# Patient Record
Sex: Female | Born: 1965 | Race: Black or African American | Hispanic: No | Marital: Single | Smoking: Never smoker
Health system: Southern US, Community
[De-identification: ages and names within clinical notes are randomized; demographics above are authoritative.]

---

## 2008-08-14 ENCOUNTER — Emergency Department (HOSPITAL_COMMUNITY): Admission: EM | Admit: 2008-08-14 | Discharge: 2008-08-14 | Payer: Self-pay | Admitting: Family Medicine

## 2009-06-19 ENCOUNTER — Emergency Department (HOSPITAL_COMMUNITY): Admission: EM | Admit: 2009-06-19 | Discharge: 2009-06-19 | Payer: Self-pay | Admitting: Emergency Medicine

## 2010-09-05 ENCOUNTER — Emergency Department (HOSPITAL_BASED_OUTPATIENT_CLINIC_OR_DEPARTMENT_OTHER): Admission: EM | Admit: 2010-09-05 | Discharge: 2010-09-05 | Payer: Self-pay | Admitting: Emergency Medicine

## 2011-01-19 LAB — URINE MICROSCOPIC-ADD ON

## 2011-01-19 LAB — URINALYSIS, ROUTINE W REFLEX MICROSCOPIC
Leukocytes, UA: NEGATIVE
Nitrite: NEGATIVE

## 2011-01-19 LAB — URINE CULTURE: Culture: NO GROWTH

## 2011-02-12 LAB — POCT URINALYSIS DIP (DEVICE)
Bilirubin Urine: NEGATIVE
Glucose, UA: NEGATIVE mg/dL
Ketones, ur: NEGATIVE mg/dL
Protein, ur: NEGATIVE mg/dL

## 2011-08-09 LAB — POCT URINALYSIS DIP (DEVICE)
Bilirubin Urine: NEGATIVE
Nitrite: NEGATIVE
Operator id: 239701
pH: 6.5

## 2011-08-09 LAB — POCT PREGNANCY, URINE: Preg Test, Ur: NEGATIVE

## 2012-01-25 ENCOUNTER — Ambulatory Visit: Payer: BC Managed Care – PPO

## 2012-07-17 ENCOUNTER — Ambulatory Visit (INDEPENDENT_AMBULATORY_CARE_PROVIDER_SITE_OTHER): Payer: BC Managed Care – PPO | Admitting: Family Medicine

## 2012-07-17 VITALS — BP 101/70 | HR 82 | Temp 98.4°F | Resp 17 | Ht 63.5 in | Wt 116.0 lb

## 2012-07-17 DIAGNOSIS — K047 Periapical abscess without sinus: Secondary | ICD-10-CM

## 2012-07-17 MED ORDER — PENICILLIN V POTASSIUM 500 MG PO TABS: 500.0000 mg | ORAL_TABLET | Freq: Four times a day (QID) | ORAL | Status: AC

## 2012-07-17 MED ORDER — IBUPROFEN 800 MG PO TABS: 800.0000 mg | ORAL_TABLET | Freq: Three times a day (TID) | ORAL | Status: AC | PRN

## 2012-07-17 NOTE — Addendum Note (Signed)
Addended by: Norberto Sorenson on: 07/17/2012 02:58 PM   Modules accepted: Level of Service

## 2012-07-17 NOTE — Progress Notes (Signed)
  Subjective:    Patient ID: Mariah Frost, female    DOB: 11-12-1965, 46 y.o.   MRN: [id number]  Abscess This is a new problem. The current episode started today. The problem occurs constantly. The problem has been rapidly worsening. Associated symptoms include chills. Pertinent negatives include no fever, headaches or neck pain. The symptoms are aggravated by eating and swallowing. She has tried nothing for the symptoms.      Review of Systems  Constitutional: Positive for chills. Negative for fever and appetite change.  HENT: Positive for facial swelling. Negative for ear pain, neck pain and neck stiffness.   Neurological: Negative for dizziness, speech difficulty and headaches.  Hematological: Negative for adenopathy.  Psychiatric/Behavioral: Negative for agitation.       Objective:   Physical Exam  Constitutional: She is oriented to person, place, and time. She appears well-developed and well-nourished. No distress.  HENT:  Head: Normocephalic and atraumatic. No trismus in the jaw.  Mouth/Throat: Oropharynx is clear and moist and mucous membranes are normal. Abnormal dentition. Dental abscesses and dental caries present. No lacerations. No oropharyngeal exudate.       Visible edema over left posterior mandibular angle.  Left posterior molar broken w/ gray root and surround gum pale and swollen  Eyes: Conjunctivae are normal. Pupils are equal, round, and reactive to light. No scleral icterus.  Neck: Normal range of motion. Neck supple.  Neurological: She is alert and oriented to person, place, and time.  Skin: Skin is warm and dry. She is not diaphoretic. No erythema.  Psychiatric: She has a normal mood and affect.          Assessment & Plan:  1. Dental abscess - Cover w/ pcn until dental appointment on 9/27 - do not stop abx prior. Prn ibuprofen for pain. vicodin offered but declined.

## 2012-07-18 NOTE — Progress Notes (Signed)
Reviewed and agree.

## 2014-07-14 ENCOUNTER — Emergency Department (HOSPITAL_COMMUNITY)
Admission: EM | Admit: 2014-07-14 | Discharge: 2014-07-14 | Disposition: A | Discharge status: Home / Self Care | Payer: BC Managed Care – PPO | Attending: Emergency Medicine | Admitting: Emergency Medicine

## 2014-07-14 ENCOUNTER — Encounter (HOSPITAL_COMMUNITY): Payer: Self-pay | Admitting: Emergency Medicine

## 2014-07-14 DIAGNOSIS — I891 Lymphangitis: Secondary | ICD-10-CM | POA: Insufficient documentation

## 2014-07-14 DIAGNOSIS — M7989 Other specified soft tissue disorders: Secondary | ICD-10-CM | POA: Diagnosis present

## 2014-07-14 DIAGNOSIS — L02619 Cutaneous abscess of unspecified foot: Secondary | ICD-10-CM | POA: Insufficient documentation

## 2014-07-14 DIAGNOSIS — L03119 Cellulitis of unspecified part of limb: Secondary | ICD-10-CM

## 2014-07-14 MED ORDER — AMOXICILLIN-POT CLAVULANATE 875-125 MG PO TABS: 1.0000 | ORAL_TABLET | Freq: Two times a day (BID) | ORAL | Status: DC

## 2014-07-14 MED ORDER — LIDOCAINE HCL 1 % IJ SOLN
INTRAMUSCULAR | Status: AC
  Administered 2014-07-14: 20 mL
  Filled 2014-07-14: qty 20

## 2014-07-14 MED ORDER — CEFTRIAXONE SODIUM 1 G IJ SOLR
1.0000 g | Freq: Once | INTRAMUSCULAR | Status: AC
  Administered 2014-07-14: 1 g via INTRAMUSCULAR
  Filled 2014-07-14: qty 10

## 2014-07-14 NOTE — ED Provider Notes (Signed)
CSN: [id number]     Arrival date & time 07/14/14  1102 History  This chart was scribed for non-physician practitioner, Ebbie Ridge, PA-C working with Rolland Porter, MD by Greggory Stallion, ED scribe. This patient was seen in room WTR9/WTR9 and the patient's care was started at 12:05 PM.   Chief Complaint  Patient presents with  . Insect Bite   The history is provided by the patient. No language interpreter was used.   HPI Comments: Mariah Frost is a 48 y.o. female who presents to the Emergency Department complaining of gradual onset, worsening left second toe pain with associated redness and swelling that started this morning. Pt thinks she might have gotten bit by something but never saw a bite mark or anything bite her. States pain and redness are starting to extend up her lower leg. Bearing weight worsens the pain. Denies significant PMHx.   History reviewed. No pertinent past medical history. History reviewed. No pertinent past surgical history. History reviewed. No pertinent family history. History  Substance Use Topics  . Smoking status: Never Smoker   . Smokeless tobacco: Not on file  . Alcohol Use: No   OB History   Grav Para Term Preterm Abortions TAB SAB Ect Mult Living                 Review of Systems All other systems negative except as documented in the HPI. All pertinent positives and negatives as reviewed in the HPI.  Allergies  Demerol  Home Medications   Prior to Admission medications   Not on File   BP 102/75  Pulse 102  Temp(Src) 98.1 F (36.7 C) (Oral)  Resp 18  SpO2 100%  LMP 07/08/2014  Physical Exam  Nursing note and vitals reviewed. Constitutional: She is oriented to person, place, and time. She appears well-developed and well-nourished. No distress.  HENT:  Head: Normocephalic and atraumatic.  Eyes: Conjunctivae and EOM are normal.  Neck: Neck supple.  Cardiovascular: Normal rate.   Pulmonary/Chest: Effort normal. No respiratory distress.   Musculoskeletal: Normal range of motion.  Swelling and redness to left second toe with redness extending to dorsum of foot. Red streaking extending to below her knee.   Neurological: She is alert and oriented to person, place, and time.  Skin: Skin is warm and dry.  Psychiatric: She has a normal mood and affect. Her behavior is normal.    ED Course  Procedures (including critical care time)  DIAGNOSTIC STUDIES: Oxygen Saturation is 100% on RA, normal by my interpretation.    COORDINATION OF CARE: 12:09 PM-Discussed treatment plan which includes antibiotics with pt at bedside and pt agreed to plan.   Patient will be advised to return here as needed. Told to return here for worsening in her condition   The patient has been seen by the attending Physician.   I personally performed the services described in this documentation, which was scribed in my presence. The recorded information has been reviewed and is accurate.  Carlyle Dolly, PA-C 07/14/14 1328

## 2014-07-14 NOTE — Progress Notes (Signed)
  CARE MANAGEMENT ED NOTE 07/14/2014  Patient:  Mariah Frost,Mariah Frost   Account Number:  [id number]  Date Initiated:  07/14/2014  Documentation initiated by:  Edd Arbour  Subjective/Objective Assessment:   48 yr old bcbs covered [place] (EPIC/MIDAS listed as  self pay but pt confirms she forgot her insurance card but has BCBS) c/o left foot pain, swelling, questionable insect bite     Subjective/Objective Assessment Detail:   Confirms no pcp  Reports possible insect bite on 07/13/14 then increasing pain, swelling this am with attempt to go to work at [employer] today and being sent home by supervisor     Action/Plan:   Spoke with pt about coverage and importance of f/u with pcp   Action/Plan Detail:   Anticipated DC Date:  07/14/2014     Status Recommendation to Physician:   Result of Recommendation:    Other ED Services  Consult Working Plan    DC Planning Services  Other  Outpatient Services - Pt will follow up  PCP issues    Choice offered to / List presented to:            Status of service:  Completed, signed off  ED Comments:   ED Comments Detail:  [facility] ED CM spoke with pt on how to obtain an in network pcp with insurance coverage via the customer service number or web site Cm reviewed ED level of care for crisis/emergent services and community pcp level of care to manage continuous or chronic medical concerns.  The pt voiced understanding CM encouraged pt and discussed pt's responsibility to verify with pt's insurance carrier that any recommended medical provider offered by any emergency room or a hospital provider is within the carrier's network. The pt voiced understanding

## 2014-07-14 NOTE — ED Notes (Signed)
AVS explained in detail. Knows to take antibiotic until completion. No other questions/concerns. Ambulatory with AVS in hand.

## 2014-07-14 NOTE — ED Notes (Addendum)
Initial contact-pt Ambulatory and moving all extremities. Went to visit sister in [place] last night and was outside for a minute. Bitten by unknown insect on left foot-second toe. Denies itching. C/o pain on foot with redness and limited ROM on left foot. Redness visible all the way up calf. Visible swelling on left second toe. C/o pain in lower leg also. Hasn't taken any medications. Denies SOB but says, "I just don't feel good." Awaiting PA.

## 2014-07-14 NOTE — ED Provider Notes (Signed)
Patient seen examined. Has a cellulitis to the dorsum of the foot. Lymphangitis extending to the mid shin. No obvious ulceration or wound to the foot. Care discussed with Ebbie Ridge PA. Agree with his assessment. Primary IM Rocephin, home one twice a day Augmentin. Careful return precautions including any worsening.  Rolland Porter, MD 07/14/14 1251

## 2014-07-14 NOTE — Discharge Instructions (Signed)
Return here in 2 days for recheck. elevate the foot and use heat on the area

## 2014-07-15 NOTE — ED Provider Notes (Signed)
Medical screening examination/treatment/procedure(s) were performed by non-physician practitioner and as supervising physician I was immediately available for consultation/collaboration.   EKG Interpretation None        Marquis Diles, MD 07/15/14 0708 

## 2015-02-05 ENCOUNTER — Ambulatory Visit (INDEPENDENT_AMBULATORY_CARE_PROVIDER_SITE_OTHER): Payer: BLUE CROSS/BLUE SHIELD

## 2015-02-05 ENCOUNTER — Ambulatory Visit (INDEPENDENT_AMBULATORY_CARE_PROVIDER_SITE_OTHER): Payer: BLUE CROSS/BLUE SHIELD | Admitting: Physician Assistant

## 2015-02-05 VITALS — BP 110/84 | HR 84 | Temp 98.7°F | Resp 16 | Ht 63.5 in | Wt 119.0 lb

## 2015-02-05 DIAGNOSIS — J029 Acute pharyngitis, unspecified: Secondary | ICD-10-CM

## 2015-02-05 DIAGNOSIS — R059 Cough, unspecified: Secondary | ICD-10-CM

## 2015-02-05 DIAGNOSIS — R0989 Other specified symptoms and signs involving the circulatory and respiratory systems: Secondary | ICD-10-CM | POA: Diagnosis not present

## 2015-02-05 DIAGNOSIS — R05 Cough: Secondary | ICD-10-CM | POA: Diagnosis not present

## 2015-02-05 LAB — POCT RAPID STREP A (OFFICE): Rapid Strep A Screen: NEGATIVE

## 2015-02-05 MED ORDER — HYDROCODONE-HOMATROPINE 5-1.5 MG/5ML PO SYRP
5.0000 mL | ORAL_SOLUTION | Freq: Three times a day (TID) | ORAL | Status: DC | PRN
Start: 2015-02-05 — End: 2016-11-29

## 2015-02-05 NOTE — Progress Notes (Signed)
Subjective:    Patient ID: Mariah Frost, female    DOB: Jan 16, 1966, 49 y.o.   MRN: [id number]  Chief Complaint  Patient presents with  . Cough  . Fever  . Sore Throat    x 3 ays   There are no active problems to display for this patient.  Prior to Admission medications   Medication Sig Start Date End Date Taking? Authorizing Provider  HYDROcodone-homatropine (HYCODAN) 5-1.5 MG/5ML syrup Take 5 mLs by mouth every 8 (eight) hours as needed for cough. 02/05/15   Donnajean Lopes, PA   Medications, allergies, past medical history, surgical history, family history, social history and problem list reviewed and updated.  HPI  49 yof with no significant pmh presents with 3 day uri sx.   Initially sore throat, body aches, fatigue, and mild non prod cough. Has been having fairly constant fevers. Typically 102. Resolves with ibuprofen. Had aleve 3 hrs prior to coming here. Feels very run down.   Denies head congestion, otalgia, abd pain, n/v, diarrhea. Denies cp, sob.   No flu shot this year. Works in [employer], several sick contacts recently.   Review of Systems See HPI.     Objective:   Physical Exam  Constitutional: She appears well-developed and well-nourished.  Non-toxic appearance. She does not have a sickly appearance. She appears ill. No distress.  BP 110/84 mmHg  Pulse 84  Temp(Src) 98.7 F (37.1 C) (Oral)  Resp 16  Ht 5' 3.5" (1.613 m)  Wt 119 lb (53.978 kg)  BMI 20.75 kg/m2  SpO2 97%  LMP 02/02/2015 (Approximate)'  Temp normal -- nsaid 3 hrs prior    HENT:  Right Ear: Tympanic membrane normal.  Left Ear: Tympanic membrane normal.  Nose: Mucosal edema and rhinorrhea present. Right sinus exhibits no maxillary sinus tenderness and no frontal sinus tenderness. Left sinus exhibits no maxillary sinus tenderness and no frontal sinus tenderness.  Mouth/Throat: Uvula is midline, oropharynx is clear and moist and mucous membranes are normal. No oropharyngeal exudate, posterior  oropharyngeal edema, posterior oropharyngeal erythema or tonsillar abscesses.  Neck: No Brudzinski's sign noted.  Pulmonary/Chest: Effort normal. She has no decreased breath sounds. She has no wheezes. She has rhonchi in the right middle field and the right lower field. She has no rales.  Lymphadenopathy:       Head (right side): No submental, no submandibular and no tonsillar adenopathy present.       Head (left side): No submental, no submandibular and no tonsillar adenopathy present.    She has no cervical adenopathy.   [facility] reading (PRIMARY) by  Dr. Katrinka Blazing. Findings: Normal  Results for orders placed or performed in visit on 02/05/15  POCT rapid strep A  Result Value Ref Range   Rapid Strep A Screen Negative Negative      Assessment & Plan:   49 yof with no significant pmh presents with 3 day uri sx.   Abnormal lung sounds - Plan: DG Chest 2 View Sore throat - Plan: POCT rapid strep A, Culture, Group A Strep Cough - Plan: HYDROcodone-homatropine (HYCODAN) 5-1.5 MG/5ML syrup --normal xray, pt requested strep throat swab which was neg, no exam findings to suggest focal bacterial infx --likely viral uri, could be flu with sudden onset along with fevers, aches, fatigue --no swab today as pt is greater than 72 hrs from onset sx, no indication for tamiflu in this immunocompetent pt --hycodan --rest, fluids, tylenol prn, lozenges, humidifier at night --rtc 4-5 days if not  improved  Donnajean Lopesodd M. Lynne Takemoto, PA-C Physician Assistant-Certified [facility]  02/05/2015 10:07 PM

## 2015-02-05 NOTE — Patient Instructions (Addendum)
Your strep swab was negative. Your chest xray looked ok and did not show pneumonia or bronchitis.  You most likely have a viral upper respiratory infection, this could have been the flu but you are out of the timeframe to take the tamiflu. Rest, fluids, tylenol as needed for the sore throat, lozenges, sleeping with a humidifier will all help. Taking the cough medicine every 8 hours as needed for the cough may help. This may make you sleepy.  Please come back to see us if you're not feeling better in 4-5 days.

## 2015-02-06 ENCOUNTER — Telehealth: Payer: Self-pay

## 2015-02-06 NOTE — Telephone Encounter (Signed)
Pt thinks she is having a reaction from the hycodan prescribed to her; she is throwing up. Is there an alternative rx we can prescribe her?

## 2015-02-07 LAB — CULTURE, GROUP A STREP: ORGANISM ID, BACTERIA: NORMAL

## 2015-02-07 NOTE — Telephone Encounter (Signed)
Spoke with pt--hycodan is making her vomit. Pt also states she has green nasal drainage. She wants to know what to do.

## 2015-02-08 ENCOUNTER — Ambulatory Visit (INDEPENDENT_AMBULATORY_CARE_PROVIDER_SITE_OTHER): Payer: BLUE CROSS/BLUE SHIELD | Admitting: Emergency Medicine

## 2015-02-08 ENCOUNTER — Telehealth: Payer: Self-pay

## 2015-02-08 VITALS — BP 92/72 | HR 88 | Temp 97.9°F | Resp 16 | Ht 63.5 in | Wt 113.5 lb

## 2015-02-08 DIAGNOSIS — J209 Acute bronchitis, unspecified: Secondary | ICD-10-CM

## 2015-02-08 DIAGNOSIS — J0140 Acute pansinusitis, unspecified: Secondary | ICD-10-CM

## 2015-02-08 MED ORDER — PSEUDOEPHEDRINE-GUAIFENESIN ER 60-600 MG PO TB12: 1.0000 | ORAL_TABLET | Freq: Two times a day (BID) | ORAL | Status: AC

## 2015-02-08 MED ORDER — AMOXICILLIN-POT CLAVULANATE 875-125 MG PO TABS: 1.0000 | ORAL_TABLET | Freq: Two times a day (BID) | ORAL | Status: DC

## 2015-02-08 NOTE — Progress Notes (Signed)
[facility] [address], [place] [id number] [phone]  Date:  02/08/2015   Name:  Mariah Frost   DOB:  Dec 03, 1965   MRN:  [id number]  PCP:  No primary care provider on file.    Chief Complaint: Follow-up; Cough; and Nasal Congestion   History of Present Illness:  Mariah Frost is a 49 y.o. very pleasant female patient who presents with the following:  Seen on Thursday and put on tussionex for cough Had negative chest radiograph Now has increased cough productive of purulent sputum No wheezing or shortness of breath Has increased nasal congestion and purulent discharge No fever or chills. No nausea or vomiting No stool change or rash No improvement with over the counter medications or other home remedies.  Denies other complaint or health concern today.   There are no active problems to display for this patient.   History reviewed. No pertinent past medical history.  History reviewed. No pertinent past surgical history.  History  Substance Use Topics  . Smoking status: Never Smoker   . Smokeless tobacco: Never Used  . Alcohol Use: No    Family History  Problem Relation Age of Onset  . Heart disease Mother   . Diabetes Mother   . Heart disease Father   . Diabetes Father     Allergies  Allergen Reactions  . Demerol [Meperidine]     "heart racing"    Medication list has been reviewed and updated.  Current Outpatient Prescriptions on File Prior to Visit  Medication Sig Dispense Refill  . HYDROcodone-homatropine (HYCODAN) 5-1.5 MG/5ML syrup Take 5 mLs by mouth every 8 (eight) hours as needed for cough. (Patient not taking: Reported on 02/08/2015) 120 mL 0   No current facility-administered medications on file prior to visit.    Review of Systems:  As per HPI, otherwise negative.    Physical Examination: Filed Vitals:   02/08/15 1206  BP: 92/72  Pulse: 88  Temp: 97.9 F (36.6 C)  Resp: 16   Filed Vitals:   02/08/15 1206   Height: 5' 3.5" (1.613 m)  Weight: 113 lb 8 oz (51.483 kg)   Body mass index is 19.79 kg/(m^2). Ideal Body Weight: Weight in (lb) to have BMI = 25: 143.1  GEN: WDWN, NAD, Non-toxic, A & O x 3 HEENT: Atraumatic, Normocephalic. Neck supple. No masses, No LAD. Ears and Nose: No external deformity. CV: RRR, No M/G/R. No JVD. No thrill. No extra heart sounds. PULM: CTA B, no wheezes, crackles, rhonchi. No retractions. No resp. distress. No accessory muscle use. ABD: S, NT, ND, +BS. No rebound. No HSM. EXTR: No c/c/e NEURO Normal gait.  PSYCH: Normally interactive. Conversant. Not depressed or anxious appearing.  Calm demeanor.    Assessment and Plan: Sinusitis Bronchitis augmentin Robitussin DM mucinex d  Signed,  Phillips OdorJeffery Maylie Ashton, MD

## 2015-02-08 NOTE — Telephone Encounter (Signed)
Pt is feeling very sick with green drainage and it's all in her head. Please advise at [phone]

## 2015-02-08 NOTE — Telephone Encounter (Signed)
Error-gg

## 2015-02-08 NOTE — Patient Instructions (Signed)

## 2015-02-09 MED ORDER — BENZONATATE 100 MG PO CAPS: 100.0000 mg | ORAL_CAPSULE | Freq: Three times a day (TID) | ORAL | Status: DC | PRN

## 2015-02-09 NOTE — Telephone Encounter (Signed)
I've sent in a different cough medicine. If she is not feeling better by now she can also take mucinex and a decongestant like sudafed which may help with her nasal drainage. If she is not better in 3-4 days she should come back to be evaluated.

## 2015-02-09 NOTE — Telephone Encounter (Signed)
Pt came back in yesterday and was seen again.

## 2015-02-09 NOTE — Telephone Encounter (Signed)
Pt was seen yesterday.

## 2016-04-11 IMAGING — CR DG CHEST 2V
2 series · 2 of 2 positions shown · non-contrast
Comparison: None.

CLINICAL DATA: Acute onset of sore throat, body aches, fatigue and
mild nonproductive cough. Fever. Initial encounter.

EXAM:
CHEST  2 VIEW

[PA]
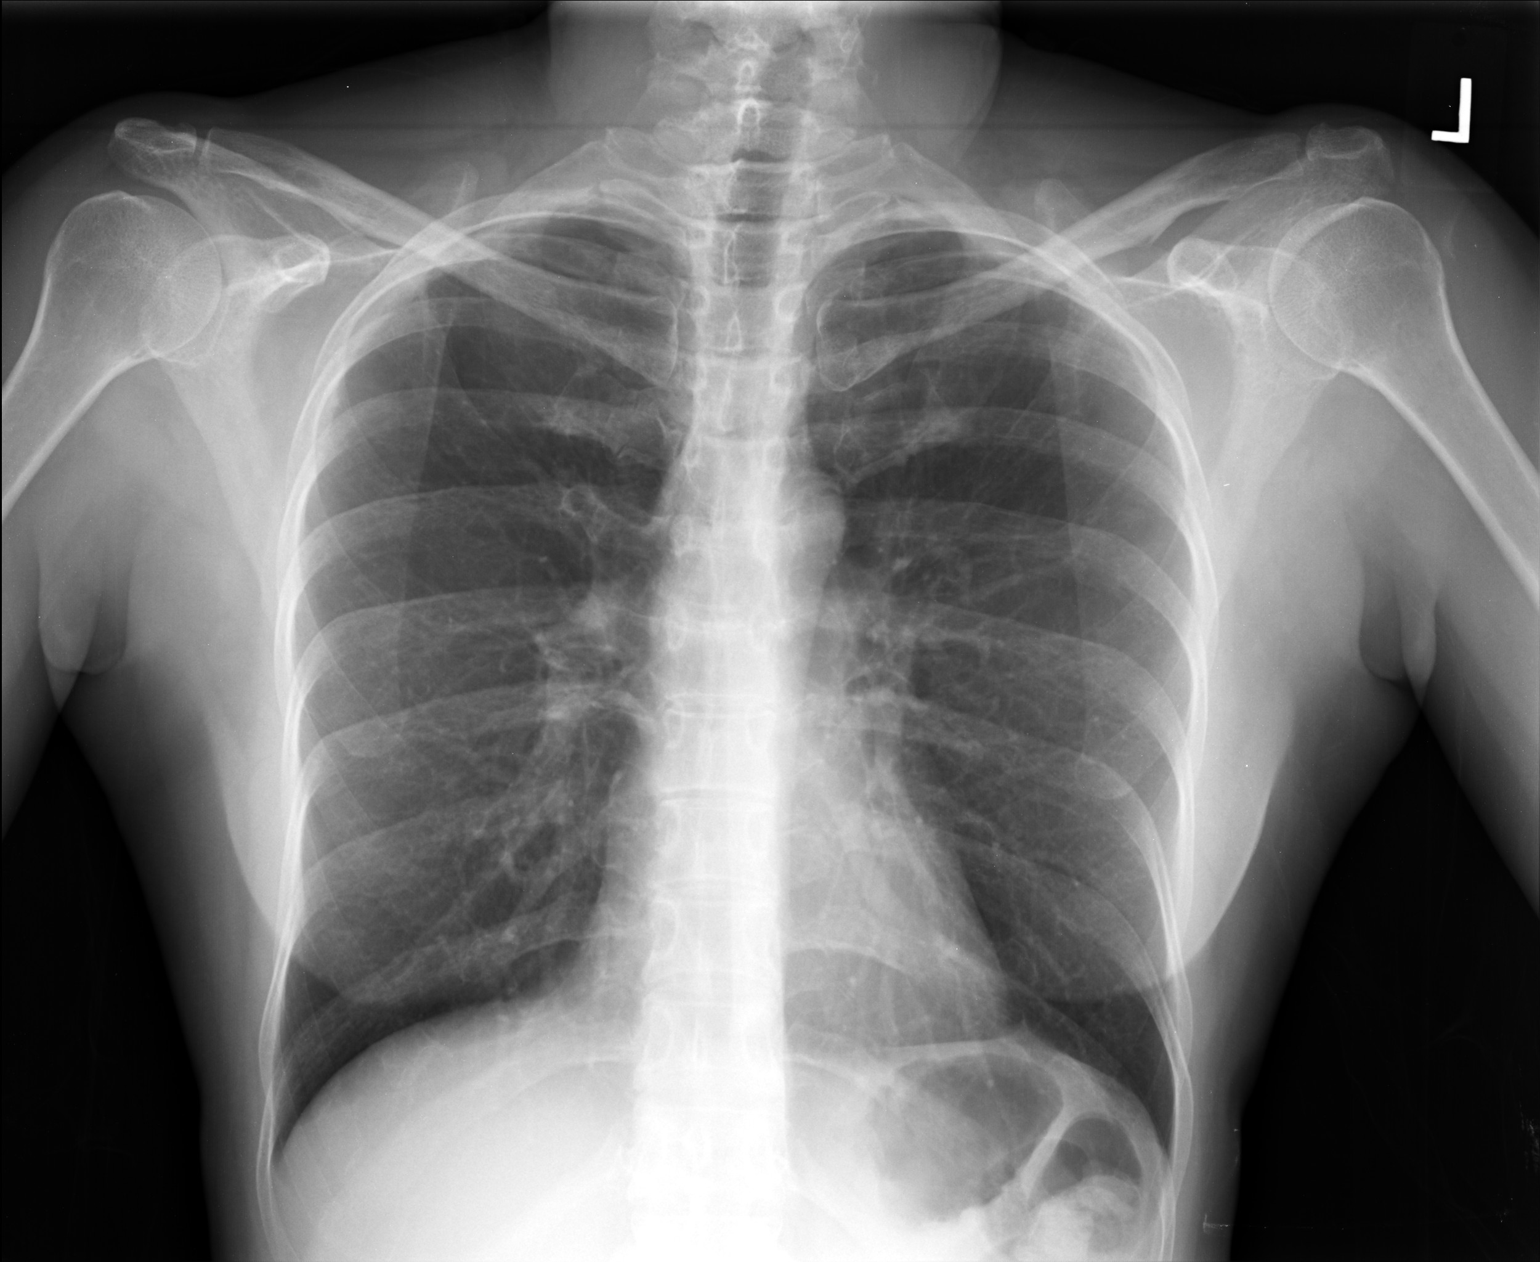

[lateral]
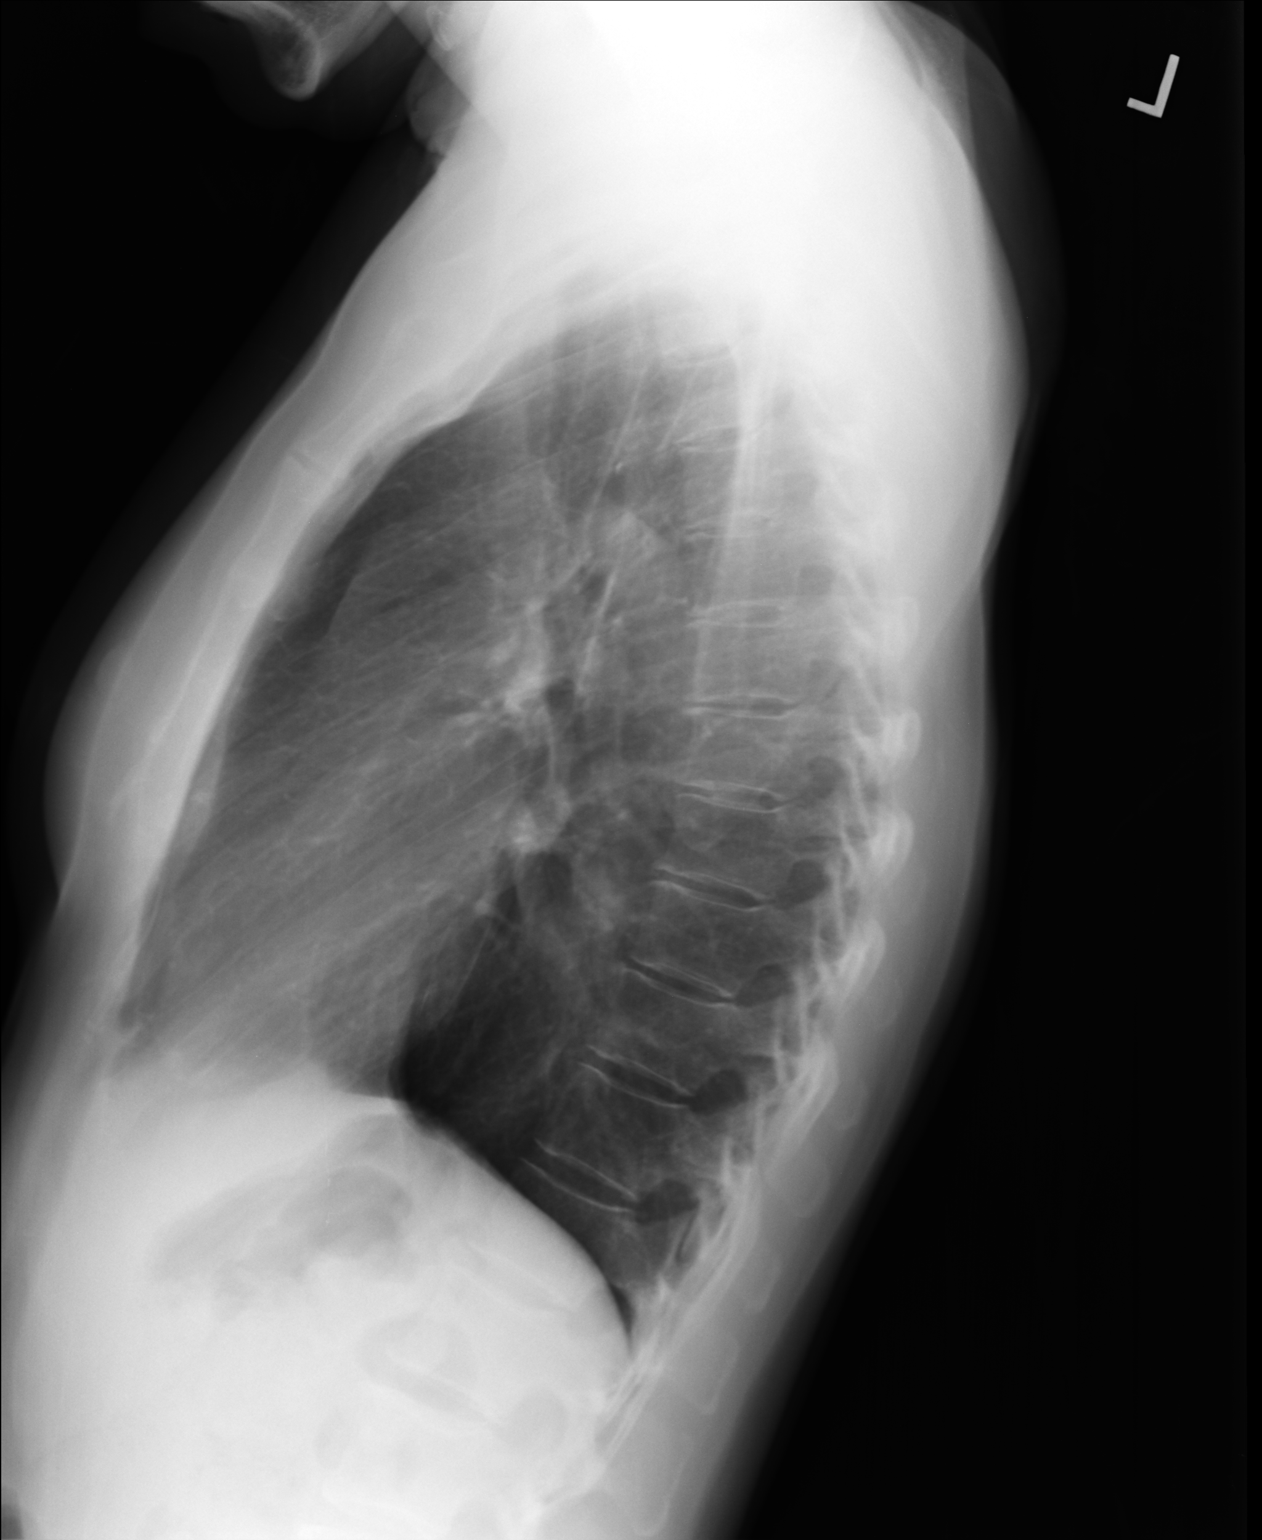

[2 of 2 positions shown; findings below may reference images not displayed]

FINDINGS: The lungs are well-aerated and clear. There is no evidence of focal
opacification, pleural effusion or pneumothorax.

The heart is normal in size; the mediastinal contour is within
normal limits. No acute osseous abnormalities are seen.
IMPRESSION: No acute cardiopulmonary process seen.

## 2016-11-29 ENCOUNTER — Ambulatory Visit (INDEPENDENT_AMBULATORY_CARE_PROVIDER_SITE_OTHER): Payer: BLUE CROSS/BLUE SHIELD | Admitting: Physician Assistant

## 2016-11-29 VITALS — BP 110/70 | HR 110 | Temp 101.7°F | Resp 18 | Ht 63.5 in | Wt 117.0 lb

## 2016-11-29 DIAGNOSIS — R112 Nausea with vomiting, unspecified: Secondary | ICD-10-CM | POA: Diagnosis not present

## 2016-11-29 DIAGNOSIS — R6883 Chills (without fever): Secondary | ICD-10-CM | POA: Diagnosis not present

## 2016-11-29 DIAGNOSIS — R059 Cough, unspecified: Secondary | ICD-10-CM

## 2016-11-29 DIAGNOSIS — J101 Influenza due to other identified influenza virus with other respiratory manifestations: Secondary | ICD-10-CM

## 2016-11-29 DIAGNOSIS — R05 Cough: Secondary | ICD-10-CM | POA: Diagnosis not present

## 2016-11-29 DIAGNOSIS — R52 Pain, unspecified: Secondary | ICD-10-CM

## 2016-11-29 LAB — POCT CBC
Granulocyte percent: 61.8 % (ref 37–80)
HCT, POC: 36.3 % — AB (ref 37.7–47.9)
Hemoglobin: 12.6 g/dL (ref 12.2–16.2)
Lymph, poc: 2.3 (ref 0.6–3.4)
MCH, POC: 30.6 pg (ref 27–31.2)
MCHC: 34.7 g/dL (ref 31.8–35.4)
MCV: 88.3 fL (ref 80–97)
MID (cbc): 0.6 (ref 0–0.9)
MPV: 7.4 fL (ref 0–99.8)
POC Granulocyte: 4.7 (ref 2–6.9)
POC LYMPH PERCENT: 30.8 %L (ref 10–50)
POC MID %: 7.4 % (ref 0–12)
Platelet Count, POC: 205 10*3/uL (ref 142–424)
RBC: 4.12 M/uL (ref 4.04–5.48)
RDW, POC: 14 %
WBC: 7.6 10*3/uL (ref 4.6–10.2)

## 2016-11-29 LAB — POC MICROSCOPIC URINALYSIS (UMFC): Mucus: ABSENT

## 2016-11-29 LAB — POCT URINALYSIS DIP (MANUAL ENTRY)
Glucose, UA: NEGATIVE
Leukocytes, UA: NEGATIVE
Nitrite, UA: NEGATIVE
Protein Ur, POC: 100 — AB
Spec Grav, UA: >=1.03
Urobilinogen, UA: 0.2
pH, UA: 5.5

## 2016-11-29 LAB — POCT INFLUENZA A/B
Influenza A, POC: POSITIVE — AB
Influenza B, POC: NEGATIVE

## 2016-11-29 MED ORDER — HYDROCODONE-HOMATROPINE 5-1.5 MG/5ML PO SYRP: 5.0000 mL | ORAL_SOLUTION | Freq: Three times a day (TID) | ORAL | 0 refills | Status: DC | PRN

## 2016-11-29 MED ORDER — FLUTICASONE PROPIONATE 50 MCG/ACT NA SUSP: 2.0000 | Freq: Every day | NASAL | 6 refills | Status: DC

## 2016-11-29 MED ORDER — ONDANSETRON HCL 8 MG PO TABS: 8.0000 mg | ORAL_TABLET | Freq: Three times a day (TID) | ORAL | 0 refills | Status: DC | PRN

## 2016-11-29 MED ORDER — OSELTAMIVIR PHOSPHATE 75 MG PO CAPS
75.0000 mg | ORAL_CAPSULE | Freq: Two times a day (BID) | ORAL | 0 refills | Status: DC
Start: 2016-11-29 — End: 2017-10-11

## 2016-11-29 MED ORDER — BENZONATATE 100 MG PO CAPS: 100.0000 mg | ORAL_CAPSULE | Freq: Three times a day (TID) | ORAL | 0 refills | Status: DC | PRN

## 2016-11-29 NOTE — Progress Notes (Signed)
Mariah Frost  MRN: [id number] DOB: February 14, 1966  PCP: No primary care provider on file.  Subjective:  Pt is a 51 year old female who presents to clinic for chills, body aches and vomiting x 28 hours. +body aches, "my bones ache", weakness, fatigue, decreased appetitie. She has not eaten anything since yesterday. Three episodes of vomiting.  She has not taken anything to feel better.  Denies headache, burning with urination, increased frequency, abdominal pain, diarrhea, sneezing, chest pain.  Sick contacts: a few people at her work have the flu.  She did not get the flu shot this year.  Vitals in office: Pulse 110, Temp 101.7  Review of Systems  Constitutional: Positive for chills, fatigue and fever. Negative for diaphoresis.  HENT: Positive for congestion, rhinorrhea and sinus pressure. Negative for postnasal drip, sneezing and sore throat.   Respiratory: Positive for cough. Negative for chest tightness, shortness of breath and wheezing.   Cardiovascular: Negative for chest pain and palpitations.  Gastrointestinal: Positive for vomiting. Negative for abdominal pain, diarrhea and nausea.  Neurological: Positive for weakness and light-headedness. Negative for headaches.    There are no active problems to display for this patient.   No current outpatient prescriptions on file prior to visit.   No current facility-administered medications on file prior to visit.     Allergies  Allergen Reactions  . Demerol [Meperidine]     "heart racing"     Objective:  BP 110/70 (BP Location: Right Arm, Patient Position: Sitting, Cuff Size: Small)   Pulse (!) 110   Temp (!) 101.7 F (38.7 C) (Oral)   Resp 18   Ht 5' 3.5" (1.613 m)   Wt 117 lb (53.1 kg)   SpO2 97%   BMI 20.40 kg/m   Physical Exam  Constitutional: She is oriented to person, place, and time and well-developed, well-nourished, and in no distress. No distress.  Cardiovascular: Regular rhythm and normal heart sounds.   Tachycardia present.   Pulmonary/Chest: Effort normal and breath sounds normal.  Lymphadenopathy:       Head (right side): Posterior auricular adenopathy present.       Head (left side): Submandibular and posterior auricular adenopathy present.  Neurological: She is alert and oriented to person, place, and time. GCS score is 15.  Skin: Skin is warm and dry.  Psychiatric: Mood, memory, affect and judgment normal.  Vitals reviewed.  Results for orders placed or performed in visit on 11/29/16  POCT Influenza A/B  Result Value Ref Range   Influenza A, POC Positive (A) Negative   Influenza B, POC Negative Negative  POCT urinalysis dipstick  Result Value Ref Range   Color, UA yellow yellow   Clarity, UA clear clear   Glucose, UA negative negative   Bilirubin, UA moderate (A) negative   Ketones, POC UA small (15) (A) negative   Spec Grav, UA >=1.030    Blood, UA moderate (A) negative   pH, UA 5.5    Protein Ur, POC =100 (A) negative   Urobilinogen, UA 0.2    Nitrite, UA Negative Negative   Leukocytes, UA Negative Negative  POCT Microscopic Urinalysis ([facility])  Result Value Ref Range   WBC,UR,HPF,POC None None WBC/hpf   RBC,UR,HPF,POC None None RBC/hpf   Bacteria None None, Too numerous to count   Mucus Absent Absent   Epithelial Cells, UR Per Microscopy None None, Too numerous to count cells/hpf  POCT CBC  Result Value Ref Range   WBC 7.6 4.6 - 10.2  K/uL   Lymph, poc 2.3 0.6 - 3.4   POC LYMPH PERCENT 30.8 10 - 50 %L   MID (cbc) 0.6 0 - 0.9   POC MID % 7.4 0 - 12 %M   POC Granulocyte 4.7 2 - 6.9   Granulocyte percent 61.8 37 - 80 %G   RBC 4.12 4.04 - 5.48 M/uL   Hemoglobin 12.6 12.2 - 16.2 g/dL   HCT, POC 40.936.3 (A) 81.137.7 - 47.9 %   MCV 88.3 80 - 97 fL   MCH, POC 30.6 27 - 31.2 pg   MCHC 34.7 31.8 - 35.4 g/dL   RDW, POC 91.414.0 %   Platelet Count, POC 205 142 - 424 K/uL   MPV 7.4 0 - 99.8 fL    Assessment and Plan :  1. Influenza A 2. Chills 3. Nausea and vomiting,  intractability of vomiting not specified, unspecified vomiting type 4. Body aches 5. Cough - PR NORMAL SALINE SOLUTION INFUS - PR CATH IMPL VASC ACCESS PORTAL - POCT Influenza A/B - POCT urinalysis dipstick - POCT Microscopic Urinalysis ([facility]) - POCT CBC - oseltamivir (TAMIFLU) 75 MG capsule; Take 1 capsule (75 mg total) by mouth 2 (two) times daily.  Dispense: 10 capsule; Refill: 0 - fluticasone (FLONASE) 50 MCG/ACT nasal spray; Place 2 sprays into both nostrils daily.  Dispense: 16 g; Refill: 6 - ondansetron (ZOFRAN) 8 MG tablet; Take 1 tablet (8 mg total) by mouth every 8 (eight) hours as needed for nausea or vomiting.  Dispense: 10 tablet; Refill: 0 - HYDROcodone-homatropine (HYCODAN) 5-1.5 MG/5ML syrup; Take 5 mLs by mouth every 8 (eight) hours as needed for cough.  Dispense: 120 mL; Refill: 0 - benzonatate (TESSALON) 100 MG capsule; Take 1-2 capsules (100-200 mg total) by mouth 3 (three) times daily as needed for cough.  Dispense: 40 capsule; Refill: 0 - pt reports mild improvement after saline infusion. Will treat with Tamiflu. Supportive care: Push fluids, rest, contact precautions discussed. RTC in 5-7 days if no improvement.   Marco CollieWhitney Jozsef Wescoat, PA-C  [facility] 11/29/2016 12:30 PM

## 2016-11-29 NOTE — Patient Instructions (Addendum)
You are contagious, please avoid contact with others.  Please stay well hydrates. Drink at least 2 liters of fluid a day. Get plenty of rest. Warm tea with honey.  RTC in 5-7 days if you are not better.   Thank you for coming in today. I hope you feel we met your needs.  Feel free to call [facility] if you have any questions or further requests.  Please consider signing up for MyChart if you do not already have it, as this is a great way to communicate with me.  Best,  Whitney McVey, PA-C   IF you received an x-ray today, you will receive an invoice from [facility]. Please contact [facility] at [phone] with questions or concerns regarding your invoice.   IF you received labwork today, you will receive an invoice from [facility]. Please contact [facility] at [phone] with questions or concerns regarding your invoice.   Our billing staff will not be able to assist you with questions regarding bills from these companies.  You will be contacted with the lab results as soon as they are available. The fastest way to get your results is to activate your My Chart account. Instructions are located on the last page of this paperwork. If you have not heard from [place] regarding the results in 2 weeks, please contact this office.

## 2016-12-02 ENCOUNTER — Telehealth: Payer: Self-pay | Admitting: Emergency Medicine

## 2016-12-02 NOTE — Telephone Encounter (Signed)
Pt called in with possible medication reaction to Tamiflu Noticed rash on one side of arm with itching the next day after starting Denies tongue/lip swelling or sob.  Advised to stop taking medication and return to clinic tomorrow. Scheduled @ 830am

## 2016-12-03 ENCOUNTER — Ambulatory Visit (INDEPENDENT_AMBULATORY_CARE_PROVIDER_SITE_OTHER): Payer: BLUE CROSS/BLUE SHIELD | Admitting: Physician Assistant

## 2016-12-03 DIAGNOSIS — L309 Dermatitis, unspecified: Secondary | ICD-10-CM

## 2016-12-03 NOTE — Progress Notes (Signed)
     Chief Complaint  Patient presents with  . Rash    History of Present Illness: Patient presents for evaluation of rash on the LEFT upper arm. The rash began after starting Tamiflu, and she called the office and was advised to stop the medication. Today she is going back to work, and would like the area checked.  Minimal itching. No pain. Respiratory symptoms have resolved.   Allergies  Allergen Reactions  . Demerol [Meperidine]     "heart racing"    Prior to Admission medications   Medication Sig Start Date End Date Taking? Authorizing Provider  benzonatate (TESSALON) 100 MG capsule Take 1-2 capsules (100-200 mg total) by mouth 3 (three) times daily as needed for cough. 11/29/16   Elizabeth Whitney McVey, PA-C  fluticasone (FLONASE) 50 MCG/ACT nasal spray Place 2 sprays into both nostrils daily. 11/29/16   Elizabeth Whitney McVey, PA-C  HYDROcodone-homatropine [facility](HYCODAN) 5-1.5 MG/5ML syrup Take 5 mLs by mouth every 8 (eight) hours as needed for cough. 11/29/16   Elizabeth Whitney McVey, PA-C  ondansetron (ZOFRAN) 8 MG tablet Take 1 tablet (8 mg total) by mouth every 8 (eight) hours as needed for nausea or vomiting. 11/29/16   Madelaine BhatElizabeth Whitney McVey, PA-C  oseltamivir (TAMIFLU) 75 MG capsule Take 1 capsule (75 mg total) by mouth 2 (two) times daily. 11/29/16   Madelaine BhatElizabeth Whitney McVey, PA-C    There are no active problems to display for this patient.    Physical Exam  Constitutional: She is oriented to person, place, and time. She appears well-developed and well-nourished. She is active and cooperative. No distress.  There were no vitals taken for this visit.   Eyes: Conjunctivae are normal.  Pulmonary/Chest: Effort normal.  Neurological: She is alert and oriented to person, place, and time.  Skin:     Psychiatric: She has a normal mood and affect. Her speech is normal and behavior is normal.      ASSESSMENT & PLAN:  1. Dermatitis Reassured. No treatment  needed. Unlikely to represent reaction to medication. RTC PRN.   Fernande Brashelle S. Danielys Madry, PA-C Physician Assistant-Certified Primary Care at [facility]

## 2017-03-24 ENCOUNTER — Other Ambulatory Visit (HOSPITAL_COMMUNITY): Payer: Self-pay | Admitting: Surgery

## 2017-10-11 ENCOUNTER — Ambulatory Visit: Payer: BLUE CROSS/BLUE SHIELD | Admitting: Physician Assistant

## 2017-10-11 ENCOUNTER — Encounter: Payer: Self-pay | Admitting: Physician Assistant

## 2017-10-11 VITALS — BP 122/72 | HR 95 | Temp 98.6°F | Resp 17 | Ht 64.0 in | Wt 115.0 lb

## 2017-10-11 DIAGNOSIS — B9789 Other viral agents as the cause of diseases classified elsewhere: Secondary | ICD-10-CM

## 2017-10-11 DIAGNOSIS — J329 Chronic sinusitis, unspecified: Secondary | ICD-10-CM

## 2017-10-11 DIAGNOSIS — R0981 Nasal congestion: Secondary | ICD-10-CM

## 2017-10-11 MED ORDER — FLUTICASONE PROPIONATE 50 MCG/ACT NA SUSP: 2.0000 | Freq: Every day | NASAL | 6 refills | Status: DC

## 2017-10-11 MED ORDER — LORATADINE-PSEUDOEPHEDRINE ER 10-240 MG PO TB24
1.0000 | ORAL_TABLET | Freq: Every day | ORAL | 0 refills | Status: DC
Start: 2017-10-11 — End: 2018-01-31

## 2017-10-11 NOTE — Progress Notes (Signed)
   Mariah GainRobin L Frost  MRN: [id number] DOB: January 21, 1966  PCP: Patient, No Pcp Per  Subjective:  Pt is a 51 year old female who presents to clinic for cough and nasal congestion x 2 weeks.  Last week she had a sore throat and fever. This has resolved. She endorses mild headache and nasal drainage. Overall she feels like she is improving.  She is not taking anything to feel better.  She is staying well hydrated. Denies cough, chills, fatigue, shob, wheezing, facial pain, ear pain.   Review of Systems  Constitutional: Positive for fever. Negative for chills, diaphoresis and fatigue.  HENT: Positive for postnasal drip and sore throat. Negative for congestion, rhinorrhea, sinus pressure and sinus pain.   Respiratory: Negative for cough, shortness of breath and wheezing.   Psychiatric/Behavioral: Negative for sleep disturbance.    There are no active problems to display for this patient.   No current outpatient medications on file prior to visit.   No current facility-administered medications on file prior to visit.     Allergies  Allergen Reactions  . Demerol [Meperidine]     "heart racing"     Objective:  BP 122/72   Pulse 95   Temp 98.6 F (37 C) (Oral)   Resp 17   Ht 5\' 4"  (1.626 m)   Wt 115 lb (52.2 kg)   SpO2 98%   BMI 19.74 kg/m   Physical Exam  Constitutional: She is oriented to person, place, and time and well-developed, well-nourished, and in no distress. No distress.  HENT:  Right Ear: Tympanic membrane normal.  Left Ear: Tympanic membrane normal.  Nose: Mucosal edema present. No rhinorrhea. Right sinus exhibits no maxillary sinus tenderness and no frontal sinus tenderness. Left sinus exhibits no maxillary sinus tenderness and no frontal sinus tenderness.  Mouth/Throat: Oropharynx is clear and moist and mucous membranes are normal.  Cardiovascular: Normal rate, regular rhythm and normal heart sounds.  Pulmonary/Chest: Effort normal and breath sounds normal. No  respiratory distress. She has no wheezes. She has no rales.  Neurological: She is alert and oriented to person, place, and time. GCS score is 15.  Skin: Skin is warm and dry.  Psychiatric: Mood, memory, affect and judgment normal.  Vitals reviewed.   Assessment and Plan :  1. Viral sinusitis 2. Nasal congestion - fluticasone (FLONASE) 50 MCG/ACT nasal spray; Place 2 sprays into both nostrils daily.  Dispense: 16 g; Refill: 6 - loratadine-pseudoephedrine (CLARITIN-D 24 HOUR) 10-240 MG 24 hr tablet; Take 1 tablet by mouth daily.  Dispense: 30 tablet; Refill: 0 - Pt c/o nasal drainage x 2 weeks. She is clinically improving. Plan to treat supportively. Consider sinusitis treatment if symptoms worsen.   Marco CollieWhitney Marylynn Rigdon, PA-C  Primary Care at [facility] 10/11/2017 4:48 PM

## 2017-10-11 NOTE — Patient Instructions (Addendum)
Symptomatic therapies-Symptomatic management of acute rhinosinusitis (ARS) aims to relieve symptoms of nasal obstruction and runny nose as well as the systemic signs and symptoms such as fever and fatigue. When needed, we suggest over-the-counter (OTC) analgesics and antipyretics, saline irrigation, and intranasal glucocorticoids for symptomatic management in patients with ARS. Analgesics and antipyretics-OTC analgesics and antipyretics such as nonsteroidal anti-inflammatory drugs and acetaminophen can be used for pain and fever relief as needed. Saline irrigation-Mechanical irrigation with saline may reduce the need for pain medication and improve overall patient comfort, particularly in patients with frequent sinus infections. It is important that irrigants be prepared from sterile or bottled water. (See below for instructions)  Intranasal glucocorticoids- Intranasal glucocorticoids are likely to be most beneficial for patients with underlying allergies. This allows improved sinus drainage. A higher dose of intranasal glucocorticoids had a stronger effect on symptom improvement. -Other- ?Oral decongestants - Oral decongestants may be useful when eustachian tube dysfunction is a factor for patients with AVRS. These patients may benefit from a short course (three to five days) of oral decongestants. Oral decongestants should be used with caution in patients with cardiovascular disease, hypertension, angle-closure glaucoma, or bladder neck obstruction. ?Intranasal decongestants - Intranasal decongestants are often used as symptomatic therapies by patients. These agents, such as oxymetazoline, may provide a subjective sense of improved nasal patency. There is also concern that intranasal decongestants themselves may provoke mucosal inflammation. If used, topical decongestants should be used sparingly for no more than three consecutive days to avoid rebound congestion, addiction, and mucosal damage  associated with long-term use. ?Antihistamines - Antihistamines are frequently used for symptom relief due to their drying effects; however, there are no studies investigating their efficacy for ARS. Over-drying of the mucosa may lead to further discomfort. Additionally, antihistamines are often associated with adverse effects.  ?Mucolytics - Mucolytics such as guaifenesin serve to thin secretions and may promote ease of mucus drainage and clearance.   SALINE NASAL IRRIGATION  The benefits  1. Saline (saltwater) washes the mucus and irritants from your nose.  2. The sinus passages are moisturized.  3. Studies have also shown that a nasal irrigation improves cell function (the cells that move the mucus work better).  The recipe  Use a one-quart glass jar that is thoroughly cleansed.  You may use a large medical syringe (30 cc), water pick with an irrigation tip (preferred method), squeeze bottle, or Neti pot. Do not use a baby bulb syringe. The syringe or pick should be sterilized frequently or replaced every two to three weeks to avoid contamination and infection.  Fill with water that has been distilled, previously boiled, or otherwise sterilized. Plain tap water is not recommended, because it is not necessarily sterile.  Add 1 to 1 heaping teaspoons of pickling/canning salt. Do not use table salt, because it contains a large number of additives.  Add 1 teaspoon of baking soda (pure bicarbonate).  Mix ingredients together, and store at room temperature. Discard after one week.  You may also make up a solution from premixed packets that are commercially prepared specifically for nasal irrigation.  The instructions  Irrigate your nose with saline one to two times per day.   If you have been told to use nasal medication, you should always use your saline solution first. The nasal medication is much more effective when sprayed onto clean nasal membranes, and the spray will reach deeper into the  nose.   Pour the amount of fluid you plan to use into  a clean bowl. Do not put your used syringe back into the storage container, because it contaminates your solution.   You may warm the solution slightly in the microwave, but be sure that the solution is not hot.   Bend over the sink (some people do this in the shower), and squirt the solution into each side of your nose, aiming the stream toward the back of your head, not the top of your head. The solution should flow into one nostril and out of the other, but it will not harm you if you swallow a little.   Some people experience a little burning sensation the first few times that they use buffered saline solution, but this usually goes away after they adapt to it.    Thank you for coming in today. I hope you feel we met your needs.  Feel free to call PCP if you have any questions or further requests.  Please consider signing up for MyChart if you do not already have it, as this is a great way to communicate with me.  Best,  Whitney McVey, PA-C   IF you received an x-ray today, you will receive an invoice from [facility]. Please contact [facility] at [phone] with questions or concerns regarding your invoice.   IF you received labwork today, you will receive an invoice from [place]. Please contact [facility] at [phone] with questions or concerns regarding your invoice.   Our billing staff will not be able to assist you with questions regarding bills from these companies.  You will be contacted with the lab results as soon as they are available. The fastest way to get your results is to activate your My Chart account. Instructions are located on the last page of this paperwork. If you have not heard from [place] regarding the results in 2 weeks, please contact this office.

## 2017-10-13 ENCOUNTER — Telehealth: Payer: Self-pay | Admitting: Physician Assistant

## 2017-10-13 ENCOUNTER — Other Ambulatory Visit: Payer: Self-pay | Admitting: Physician Assistant

## 2017-10-13 DIAGNOSIS — J320 Chronic maxillary sinusitis: Secondary | ICD-10-CM

## 2017-10-13 MED ORDER — AMOXICILLIN-POT CLAVULANATE 875-125 MG PO TABS: 1.0000 | ORAL_TABLET | Freq: Two times a day (BID) | ORAL | 0 refills | Status: AC

## 2017-10-13 NOTE — Telephone Encounter (Signed)
Abx sent to pharmacy

## 2017-10-13 NOTE — Telephone Encounter (Signed)
Please see note below. 

## 2017-10-13 NOTE — Telephone Encounter (Signed)
Pt said McVey told her to call if she wasn't feeling better and she would call her in an antibiotic . [phone]

## 2018-01-31 ENCOUNTER — Encounter: Payer: Self-pay | Admitting: Family Medicine

## 2018-01-31 ENCOUNTER — Ambulatory Visit: Payer: BLUE CROSS/BLUE SHIELD | Admitting: Family Medicine

## 2018-01-31 ENCOUNTER — Other Ambulatory Visit: Payer: Self-pay

## 2018-01-31 VITALS — BP 106/72 | HR 128 | Temp 99.1°F | Resp 17 | Ht 64.0 in | Wt 116.6 lb

## 2018-01-31 DIAGNOSIS — R0981 Nasal congestion: Secondary | ICD-10-CM

## 2018-01-31 DIAGNOSIS — R05 Cough: Secondary | ICD-10-CM

## 2018-01-31 DIAGNOSIS — R059 Cough, unspecified: Secondary | ICD-10-CM

## 2018-01-31 DIAGNOSIS — J3489 Other specified disorders of nose and nasal sinuses: Secondary | ICD-10-CM

## 2018-01-31 MED ORDER — FLUTICASONE PROPIONATE 50 MCG/ACT NA SUSP: 2.0000 | Freq: Two times a day (BID) | NASAL | 6 refills | Status: DC

## 2018-01-31 MED ORDER — FLUTICASONE PROPIONATE 50 MCG/ACT NA SUSP: 2.0000 | Freq: Every day | NASAL | 6 refills | Status: DC

## 2018-01-31 NOTE — Progress Notes (Signed)
Chief Complaint  Patient presents with  . Sinusitis    x 2 weeks, using otc meds with no relief, yellowish mucus drainage.    HPI  2 week history of cough and sinus pressure She started taking loratadine  She has postnasal drip Cough with yellow sputum She also blows her nose and has yellow nasal discharge Reports facial pain in the maxillary area No dizziness or balance issues She reports some chills but no fevers She denies history of chronic rhinitis    No past medical history on file.  Current Outpatient Medications  Medication Sig Dispense Refill  . fluticasone (FLONASE) 50 MCG/ACT nasal spray Place 2 sprays into both nostrils 2 (two) times daily. 16 g 6   No current facility-administered medications for this visit.     Allergies:  Allergies  Allergen Reactions  . Demerol [Meperidine]     "heart racing"    No past surgical history on file.  Social History   Socioeconomic History  . Marital status: Single    Spouse name: Not on file  . Number of children: Not on file  . Years of education: Not on file  . Highest education level: Not on file  Occupational History  . Not on file  Social Needs  . Financial resource strain: Not on file  . Food insecurity:    Worry: Not on file    Inability: Not on file  . Transportation needs:    Medical: Not on file    Non-medical: Not on file  Tobacco Use  . Smoking status: Never Smoker  . Smokeless tobacco: Never Used  Substance and Sexual Activity  . Alcohol use: No    Alcohol/week: 0.0 oz  . Drug use: No  . Sexual activity: Not on file  Lifestyle  . Physical activity:    Days per week: Not on file    Minutes per session: Not on file  . Stress: Not on file  Relationships  . Social connections:    Talks on phone: Not on file    Gets together: Not on file    Attends religious service: Not on file    Active member of club or organization: Not on file    Attends meetings of clubs or organizations: Not on  file    Relationship status: Not on file  Other Topics Concern  . Not on file  Social History Narrative  . Not on file    Family History  Problem Relation Age of Onset  . Heart disease Mother   . Diabetes Mother   . Heart disease Father   . Diabetes Father      ROS Review of Systems See HPI No malaise No diaphoresis Skin: No rash or itching Eyes: no blurry vision, no double vision GU: no dysuria or hematuria Neuro: no dizziness or headaches  all others reviewed and negative   Objective: Vitals:   01/31/18 1458  BP: 106/72  Pulse: (!) 128  Resp: 17  Temp: 99.1 F (37.3 C)  TempSrc: Oral  SpO2: 98%  Weight: 116 lb 9.6 oz (52.9 kg)  Height: 5\' 4"  (1.626 m)    Physical Exam General: alert, oriented, in NAD Head: normocephalic, atraumatic, +right maxillary sinus tenderness Eyes: EOM intact, no scleral icterus or conjunctival injection Ears: TM clear bilaterally Nose: mucosa nonerythematous, nonedematous Throat: no pharyngeal exudate or erythema Lymph: no posterior auricular, submental or cervical lymph adenopathy Heart: normal rate, normal sinus rhythm, no murmurs Lungs: clear to auscultation bilaterally, no wheezing  Assessment and Plan Zella BallRobin was seen today for sinusitis.  Diagnoses and all orders for this visit:  Nasal congestion Sinus pressure Cough  Other orders -     fluticasone (FLONASE) 50 MCG/ACT nasal spray; Place 2 sprays into both nostrils 2 (two) times daily.   Pt seen today who seems to be improving so will not do antibiotics at this time She is to try flonase bid, mucinex and zyrtec If she worsens then she CAN CALL BACK FOR AN ANTIBIOTIC - augmentin bid for 10 days  Ayyub Krall A Kajuan Guyton

## 2018-01-31 NOTE — Patient Instructions (Signed)
     IF you received an x-ray today, you will receive an invoice from [facility]. Please contact [facility] at [phone] with questions or concerns regarding your invoice.   IF you received labwork today, you will receive an invoice from [facility]. Please contact [facility] at [phone] with questions or concerns regarding your invoice.   Our billing staff will not be able to assist you with questions regarding bills from these companies.  You will be contacted with the lab results as soon as they are available. The fastest way to get your results is to activate your My Chart account. Instructions are located on the last page of this paperwork. If you have not heard from us regarding the results in 2 weeks, please contact this office.     

## 2018-02-02 ENCOUNTER — Telehealth: Payer: Self-pay | Admitting: Family Medicine

## 2018-02-02 ENCOUNTER — Other Ambulatory Visit: Payer: Self-pay

## 2018-02-02 NOTE — Telephone Encounter (Signed)
Copied from CRM [phone]. Topic: Quick Communication - Rx Refill/Question >> Feb 02, 2018  8:14 AM Leafy Roobinson, Norma J wrote: Medication: AUGMENTIN BID 10 DAY Has the patient contacted their pharmacy? No . Pt saw dr stalling yesterday and per AVS if pt is no better she will call abx into [facility] [address]

## 2018-02-02 NOTE — Telephone Encounter (Signed)
Patient has changed her mind and would like to go ahead and start on antibiotics now. Please call to her pharmacy.

## 2018-02-02 NOTE — Telephone Encounter (Signed)
You advised Augmentin BID x10 days in your note. However, no strength indicated. Please order per pt request.

## 2018-02-02 NOTE — Progress Notes (Signed)
Error

## 2018-02-02 NOTE — Telephone Encounter (Signed)
Patient said she was told she would get an antibiotic if she called back with NO problems. She does not want to go through the weekend. Please advise.

## 2018-02-03 ENCOUNTER — Other Ambulatory Visit: Payer: Self-pay

## 2018-02-03 MED ORDER — AMOXICILLIN-POT CLAVULANATE 875-125 MG PO TABS: 1.0000 | ORAL_TABLET | Freq: Two times a day (BID) | ORAL | 0 refills | Status: DC

## 2018-02-03 NOTE — Telephone Encounter (Signed)
Thank you for handling! 

## 2018-02-03 NOTE — Telephone Encounter (Signed)
Sent augmentin to pharmacy - Spoke to pt to advise.

## 2019-04-08 DIAGNOSIS — L309 Dermatitis, unspecified: Secondary | ICD-10-CM | POA: Diagnosis not present

## 2019-04-08 DIAGNOSIS — L219 Seborrheic dermatitis, unspecified: Secondary | ICD-10-CM | POA: Diagnosis not present

## 2019-05-06 DIAGNOSIS — L669 Cicatricial alopecia, unspecified: Secondary | ICD-10-CM | POA: Diagnosis not present

## 2019-05-06 DIAGNOSIS — L219 Seborrheic dermatitis, unspecified: Secondary | ICD-10-CM | POA: Diagnosis not present

## 2019-06-20 DIAGNOSIS — L669 Cicatricial alopecia, unspecified: Secondary | ICD-10-CM | POA: Diagnosis not present

## 2019-08-13 DIAGNOSIS — G514 Facial myokymia: Secondary | ICD-10-CM | POA: Diagnosis not present

## 2019-08-13 DIAGNOSIS — H1045 Other chronic allergic conjunctivitis: Secondary | ICD-10-CM | POA: Diagnosis not present

## 2019-08-13 DIAGNOSIS — H04123 Dry eye syndrome of bilateral lacrimal glands: Secondary | ICD-10-CM | POA: Diagnosis not present

## 2019-09-10 DIAGNOSIS — M65321 Trigger finger, right index finger: Secondary | ICD-10-CM | POA: Diagnosis not present

## 2019-09-10 DIAGNOSIS — M79641 Pain in right hand: Secondary | ICD-10-CM | POA: Diagnosis not present

## 2019-10-26 DIAGNOSIS — Z20828 Contact with and (suspected) exposure to other viral communicable diseases: Secondary | ICD-10-CM | POA: Diagnosis not present

## 2019-10-29 DIAGNOSIS — Z20828 Contact with and (suspected) exposure to other viral communicable diseases: Secondary | ICD-10-CM | POA: Diagnosis not present

## 2019-10-29 DIAGNOSIS — R519 Headache, unspecified: Secondary | ICD-10-CM | POA: Diagnosis not present

## 2019-11-20 DIAGNOSIS — M65321 Trigger finger, right index finger: Secondary | ICD-10-CM | POA: Diagnosis not present

## 2020-02-27 ENCOUNTER — Ambulatory Visit: Payer: Self-pay

## 2020-02-27 NOTE — Telephone Encounter (Signed)
Pt. Reports she burned her left arm last Thursday from steam from a teapot. Reports it blistered and blister has popped. Wants to sure it is not getting infected. Phone line bust at the practice. Please advise pt.  Answer Assessment - Initial Assessment Questions 1. ONSET: "When did it happen?" If happened <3 hours ago, ask: "Did you apply cold water?" If not, give First Aid Advice immediately.      Last Thursday 2. LOCATION: "Where is the burn located?"      Left forearm 3. BURN SIZE: "How large is the burn?"  The palm is roughly 1% of the total body surface area (BSA).     Fist size 4. SEVERITY OF THE BURN: "Are there any blisters?"      Blister popped 5. MECHANISM: "Tell me how it happened."     Steam from a teapot 6. PAIN: "Are you having any pain?" "How bad is the pain?" (Scale 1-10; or mild, moderate, severe)   - MILD (1-3): doesn't interfere with normal activities    - MODERATE (4-7): interferes with normal activities or awakens from sleep    - SEVERE (8-10): excruciating pain, unable to do any normal activities      5 7. INHALATION INJURY: "Were you exposed to any smoke or fumes?" If yes: "Do you have any cough or difficulty breathing?"     No 8. OTHER SYMPTOMS: "Do you have any other symptoms?" (e.g., headache, nausea)     No 9. PREGNANCY: "Is there any chance you are pregnant?" "When was your last menstrual period?"     No  Protocols used: BURNS - [facility]

## 2020-02-28 NOTE — Telephone Encounter (Signed)
Pt got a burn from the steam of a tea pot last week. Pt states her blister burst and she is trying to prevent infection. Pt has not been seen since 2019 should I make an appointment for her or just giver her directions for her burn.

## 2020-03-04 DIAGNOSIS — T22212A Burn of second degree of left forearm, initial encounter: Secondary | ICD-10-CM | POA: Diagnosis not present

## 2020-03-30 NOTE — Telephone Encounter (Signed)
Called patient and wound healed.

## 2020-07-10 DIAGNOSIS — M65321 Trigger finger, right index finger: Secondary | ICD-10-CM | POA: Diagnosis not present

## 2020-08-27 ENCOUNTER — Telehealth: Payer: Self-pay | Admitting: Adult Health

## 2020-08-27 NOTE — Telephone Encounter (Signed)
I talked to Mariah Frost about her COVID 19 diagnosis.  I cannot identify a risk factor that qualifies her for treatment.  I have asked Victorino Dike to run a SVI algorithm to identify that part of her risk.  Lillard Anes, NP

## 2020-09-16 DIAGNOSIS — M79645 Pain in left finger(s): Secondary | ICD-10-CM | POA: Diagnosis not present

## 2020-09-16 DIAGNOSIS — M65321 Trigger finger, right index finger: Secondary | ICD-10-CM | POA: Diagnosis not present

## 2020-09-16 DIAGNOSIS — M79641 Pain in right hand: Secondary | ICD-10-CM | POA: Diagnosis not present

## 2023-04-27 ENCOUNTER — Ambulatory Visit
Admission: EM | Admit: 2023-04-27 | Discharge: 2023-04-27 | Disposition: A | Discharge status: Home / Self Care | Payer: BC Managed Care – PPO

## 2023-04-27 DIAGNOSIS — Z711 Person with feared health complaint in whom no diagnosis is made: Secondary | ICD-10-CM

## 2023-04-27 NOTE — Discharge Instructions (Signed)
You can keep a blood pressure log check it twice a day for 2 weeks this will give you a better idea of what your blood pressure ranges.  It remains consistently elevated above 140/90 I would follow-up with the PCP otherwise her blood pressure does look good today.

## 2023-04-27 NOTE — ED Provider Notes (Signed)
[facility]    CSN: [id number] Arrival date & time: 04/27/23  1630      History   Chief Complaint No chief complaint on file.   HPI Mariah Frost is a 57 y.o. female presents for concern of high blood pressure.  Patient reports a couple weeks ago at a family reunion she had a nurse family member check her blood pressure and was told it was "80/140".  Was told it was high and she should have it checked.  Patient does states she was under a lot of stress and has been running around a lot that day and thinks that contributed to it.  Denies any history of high blood pressure.  She also endorses some palpitations intermittently over the past several weeks.  They are not associated with chest pain or shortness of breath.  No dizziness or syncope.  She does states she does not drink a lot of water but does drink a lot of caffeine and has been drinking mushroom coffee recently.  She also reports a lot of stress as she works 2 jobs and gets about 3 hours of sleep at night.  She does not check her blood pressure at home.  No other concerns at this time.  HPI  History reviewed. No pertinent past medical history.  There are no problems to display for this patient.   History reviewed. No pertinent surgical history.  OB History   No obstetric history on file.      Home Medications    Prior to Admission medications   Medication Sig Start Date End Date Taking? Authorizing Provider  amoxicillin-clavulanate (AUGMENTIN) 875-125 MG tablet Take 1 tablet by mouth 2 (two) times daily. 02/03/18   Doristine Bosworth, MD  fluticasone (FLONASE) 50 MCG/ACT nasal spray Place 2 sprays into both nostrils 2 (two) times daily. 01/31/18   Doristine Bosworth, MD    Family History Family History  Problem Relation Age of Onset   Heart disease Mother    Diabetes Mother    Heart disease Father    Diabetes Father     Social History Social History   Tobacco Use   Smoking status: Never   Smokeless  tobacco: Never  Vaping Use   Vaping Use: Never used  Substance Use Topics   Alcohol use: No    Alcohol/week: 0.0 standard drinks of alcohol   Drug use: No     Allergies   Demerol [meperidine] and Meperidine hcl   Review of Systems Review of Systems  Cardiovascular:  Positive for palpitations.       Concern for high blood pressure     Physical Exam Triage Vital Signs ED Triage Vitals  Enc Vitals Group     BP 04/27/23 1711 113/68     Pulse Rate 04/27/23 1711 67     Resp 04/27/23 1711 16     Temp 04/27/23 1711 98.7 F (37.1 C)     Temp Source 04/27/23 1711 Oral     SpO2 04/27/23 1711 99 %     Weight --      Height --      Head Circumference --      Peak Flow --      Pain Score 04/27/23 1715 0     Pain Loc --      Pain Edu? --      Excl. in GC? --    No data found.  Updated Vital Signs BP 113/68 (BP Location: Right Arm)  Pulse 67   Temp 98.7 F (37.1 C) (Oral)   Resp 16   LMP 02/02/2015 (Approximate)   SpO2 99%   Visual Acuity Right Eye Distance:   Left Eye Distance:   Bilateral Distance:    Right Eye Near:   Left Eye Near:    Bilateral Near:     Physical Exam Vitals and nursing note reviewed.  Constitutional:      General: She is not in acute distress.    Appearance: Normal appearance. She is not ill-appearing, toxic-appearing or diaphoretic.  HENT:     Head: Normocephalic and atraumatic.  Eyes:     Pupils: Pupils are equal, round, and reactive to light.  Cardiovascular:     Rate and Rhythm: Normal rate and regular rhythm.     Heart sounds: Normal heart sounds. No murmur heard.    No gallop.  Pulmonary:     Effort: Pulmonary effort is normal.     Breath sounds: Normal breath sounds.  Skin:    General: Skin is warm and dry.  Neurological:     General: No focal deficit present.     Mental Status: She is alert and oriented to person, place, and time.  Psychiatric:        Mood and Affect: Mood normal.        Behavior: Behavior normal.       UC Treatments / Results  Labs (all labs ordered are listed, but only abnormal results are displayed) Labs Reviewed - No data to display  EKG   Radiology No results found.  Procedures Procedures (including critical care time)  Medications Ordered in UC Medications - No data to display  Initial Impression / Assessment and Plan / UC Course  I have reviewed the triage vital signs and the nursing notes.  Pertinent labs & imaging results that were available during my care of the patient were reviewed by me and considered in my medical decision making (see chart for details).     Reviewed concerns and exam with patient.  Discussed blood pressure reading was likely 140/80 and not 80/140.  Her blood pressure is normal here at 113/68.  Advised that this is a very normal blood pressure and there is no concern with it.  Discussed multiple causes of palpitations including caffeine, dehydration, stress, thyroid, etc.  Advised to increase her hydration with water and cut back on her caffeine to see if this changes her symptoms.  Also advise she get a blood pressure cuff and keep a log of her BP taking it twice a day for couple weeks and take it to her PCP if her blood pressure remains consistently elevated above 140/90.  Patient verbalized understanding.  Reassurance provided. Follow-up with PCP as needed ER precautions reviewed and patient verbalized understanding Final Clinical Impressions(s) / UC Diagnoses   Final diagnoses:  Feared condition not demonstrated     Discharge Instructions      You can keep a blood pressure log check it twice a day for 2 weeks this will give you a better idea of what your blood pressure ranges.  It remains consistently elevated above 140/90 I would follow-up with the PCP otherwise her blood pressure does look good today.   ED Prescriptions   None    PDMP not reviewed this encounter.   Radford Pax, NP 04/27/23 [phone]

## 2023-04-27 NOTE — ED Triage Notes (Signed)
Pt reports her blood pressure was 140/80 5 days ago. Reports she had palpitations 2 weeks ago. Reports she was working a lot, she has stress and walking 4 miles.

## 2023-04-29 ENCOUNTER — Emergency Department (HOSPITAL_COMMUNITY)
Admission: EM | Admit: 2023-04-29 | Discharge: 2023-04-29 | Disposition: A | Discharge status: Home / Self Care | Payer: BC Managed Care – PPO | Attending: Emergency Medicine | Admitting: Emergency Medicine

## 2023-04-29 ENCOUNTER — Other Ambulatory Visit: Payer: Self-pay

## 2023-04-29 ENCOUNTER — Emergency Department (HOSPITAL_COMMUNITY): Payer: BC Managed Care – PPO

## 2023-04-29 DIAGNOSIS — R079 Chest pain, unspecified: Secondary | ICD-10-CM | POA: Diagnosis present

## 2023-04-29 LAB — BASIC METABOLIC PANEL
Anion gap: 7 (ref 5–15)
BUN: 14 mg/dL (ref 6–20)
CO2: 26 mmol/L (ref 22–32)
Calcium: 8.9 mg/dL (ref 8.9–10.3)
Chloride: 103 mmol/L (ref 98–111)
Creatinine, Ser: 1.01 mg/dL — ABNORMAL HIGH (ref 0.44–1.00)
GFR, Estimated: >60 mL/min (ref >60)
Glucose, Bld: 100 mg/dL — ABNORMAL HIGH (ref 70–99)
Potassium: 3.7 mmol/L (ref 3.5–5.1)
Sodium: 136 mmol/L (ref 135–145)

## 2023-04-29 LAB — CBC
HCT: 37.7 % (ref 36.0–46.0)
Hemoglobin: 12 g/dL (ref 12.0–15.0)
MCH: 29.5 pg (ref 26.0–34.0)
MCHC: 31.8 g/dL (ref 30.0–36.0)
MCV: 92.6 fL (ref 80.0–100.0)
Platelets: 262 10*3/uL (ref 150–400)
RBC: 4.07 MIL/uL (ref 3.87–5.11)
RDW: 13.2 % (ref 11.5–15.5)
WBC: 7.5 10*3/uL (ref 4.0–10.5)
nRBC: 0 % (ref 0.0–0.2)

## 2023-04-29 LAB — TROPONIN I (HIGH SENSITIVITY): Troponin I (High Sensitivity): <2 ng/L (ref <18)

## 2023-04-29 MED ORDER — KETOROLAC TROMETHAMINE 15 MG/ML IJ SOLN
15.0000 mg | Freq: Once | INTRAMUSCULAR | Status: AC
  Administered 2023-04-29: 15 mg via INTRAVENOUS
  Filled 2023-04-29: qty 1

## 2023-04-29 MED ORDER — FAMOTIDINE 20 MG PO TABS
20.0000 mg | ORAL_TABLET | Freq: Once | ORAL | Status: AC
  Administered 2023-04-29: 20 mg via ORAL
  Filled 2023-04-29: qty 1

## 2023-04-29 MED ORDER — ACETAMINOPHEN 500 MG PO TABS
1000.0000 mg | ORAL_TABLET | Freq: Once | ORAL | Status: AC
  Administered 2023-04-29: 1000 mg via ORAL
  Filled 2023-04-29: qty 2

## 2023-04-29 NOTE — ED Triage Notes (Signed)
Pt arrived via POV. C/o intermittent sternal chest pain for 1x week. No known exacerbating triggers  AOx4

## 2023-04-29 NOTE — Discharge Instructions (Signed)
You were seen today for chest pain. We did not identify any emergent cause for your symptoms. Your evaluation is most consistent with nonspecific etiology, including esophagitis or musculoskeletal pain.   Plan and next steps:   The following may be helpful in managing your symptoms:   Pain- Lidocaine Patches  Apply to affected area for up to 12 hours at a time.   Pain/Fever- Adult Tylenol dosing:  650 mg orally every 4 to 6 hours as needed, MAX: 3250 mg/24 hours   (Extra-strength) 1000 mg orally every 6 hours as needed; MAX: 3000 mg/24 hours   Do not use if you have liver disease. Read the label on the bottle.   Gastritis/Heartburn- Omeprazole Take 20mg  Daily for 14 days  Famotidine: - Take 20 mg up to twice daily as needed for 2 weeks. Findings:  You may see all of your lab and imaging results utilizing our online portal! Look in this document or ask a team member for your mychart* access information. The most notable results have additionally been verbally communicated with you and your bedside family.    Follow-up Plan:   Follow up with the patient's normal primary care provider for monitoring of this condition within 48 hours.   Signs/Symptoms that would warrant return to the ED:  Please return to the ED if you experience worsening of symptoms or any abrupt changes in your health. Standard of care precautions for your chief complaint have already been verbally communicated with you. Always be on alert for fevers, chills, shortness of breath, chest pains, or sudden changes that warrant immediate evaluation.    Thank you for allowing [place] to be a part of you and your families' care.   Glyn Ade MD

## 2023-04-29 NOTE — ED Provider Notes (Signed)
[place] EMERGENCY DEPARTMENT AT [facility] Provider Note   CSN: [id number] Arrival date & time: 04/29/23  1751     History Chief Complaint  Patient presents with   Chest Pain    HPI Mariah Frost is a 57 y.o. female presenting for chief complaint of chest pain.  57 year old female with a minimal medical history.  States that she has been under increasing stress at work including increasing hours at work, increasing strain at work.  She denies fevers or chills, nausea vomiting, syncope or shortness of breath.  Otherwise ambulatory tolerating p.o. intake.  States that she has had years of heartburn daily but typically does not cause as many symptoms as today's episode..   Patient's recorded medical, surgical, social, medication list and allergies were reviewed in the Snapshot window as part of the initial history.   Review of Systems   Review of Systems  Constitutional:  Negative for chills and fever.  HENT:  Negative for ear pain and sore throat.   Eyes:  Negative for pain and visual disturbance.  Respiratory:  Negative for cough and shortness of breath.   Cardiovascular:  Positive for chest pain. Negative for palpitations.  Gastrointestinal:  Negative for abdominal pain and vomiting.  Genitourinary:  Negative for dysuria and hematuria.  Musculoskeletal:  Negative for arthralgias and back pain.  Skin:  Negative for color change and rash.  Neurological:  Negative for seizures and syncope.  All other systems reviewed and are negative.   Physical Exam Updated Vital Signs BP 110/82   Pulse 84   Temp 98.6 F (37 C)   Resp 19   Ht 5\' 3"  (1.6 m)   Wt 59.4 kg   LMP 02/02/2015 (Approximate)   SpO2 99%   BMI 23.21 kg/m  Physical Exam Vitals and nursing note reviewed.  Constitutional:      General: She is not in acute distress.    Appearance: She is well-developed.  HENT:     Head: Normocephalic and atraumatic.  Eyes:     Conjunctiva/sclera: Conjunctivae  normal.  Cardiovascular:     Rate and Rhythm: Normal rate and regular rhythm.     Heart sounds: No murmur heard. Pulmonary:     Effort: Pulmonary effort is normal. No respiratory distress.     Breath sounds: Normal breath sounds.  Abdominal:     General: There is no distension.     Palpations: Abdomen is soft.     Tenderness: There is no abdominal tenderness. There is no right CVA tenderness or left CVA tenderness.  Musculoskeletal:        General: No swelling or tenderness. Normal range of motion.     Cervical back: Neck supple.  Skin:    General: Skin is warm and dry.  Neurological:     General: No focal deficit present.     Mental Status: She is alert and oriented to person, place, and time. Mental status is at baseline.     Cranial Nerves: No cranial nerve deficit.      ED Course/ Medical Decision Making/ A&P Clinical Course as of 04/29/23 2100  Sat Apr 29, 2023  1912 DG Chest 2 View [CC]    Clinical Course User Index [CC] Glyn Ade, MD    Procedures Procedures   Medications Ordered in ED Medications  acetaminophen (TYLENOL) tablet 1,000 mg (1,000 mg Oral Given 04/29/23 1948)  ketorolac (TORADOL) 15 MG/ML injection 15 mg (15 mg Intravenous Given 04/29/23 1949)  famotidine (PEPCID) tablet  20 mg (20 mg Oral Given 04/29/23 1948)   Medical Decision Making: Mariah Frost is a 57 y.o. female who presented to the ED today with chest pain, detailed above.  Based on patient's comorbidities, patient has a heart score of 1.    Patient placed on continuous vitals and telemetry monitoring while in ED which was reviewed periodically.  Complete initial physical exam performed, notably the patient was hemodynamically stable no acute distress.  Symptoms currently not present..   Reviewed and confirmed nursing documentation for past medical history, family history, social history.    Initial Assessment: With the patient's presentation of left-sided chest pain, most likely  diagnosis is musculoskeletal chest pain versus GERD, although ACS remains on the differential. Other diagnoses were considered including (but not limited to) pulmonary embolism, community-acquired pneumonia, aortic dissection, pneumothorax, underlying bony abnormality, anemia. These are considered less likely due to history of present illness and physical exam findings.    In particular, concerning pulmonary embolism: they deny malignancy, recent surgery, history of DVT, or calf tenderness leading to a low risk Wells score. Aortic Dissection also reconsidered but seems less likely based on the location, quality, onset, and severity of symptoms in this case. Patient has a lack of serious comorbidities for this condition including a lack of HTN or Smoking. Patient also has a lack of underlying history of AD or TAA.  This is most consistent with an acute life/limb threatening illness complicated by underlying chronic conditions.   Initial Plan: Evaluate for ACS with delta troponin and EKG evaluated as below  Evaluate for dissection, bony abnormality, or pneumonia with chest x-ray and screening laboratory evaluation including CBC, BMP  Further evaluation for pulmonary embolism not indicated at this time based on patient's Wells score.  Further evaluation for Thoracic Aortic Dissection not indicated at this time based on patient's clinical history and PE findings.   Initial Study Results: EKG was reviewed independently. Rate, rhythm, axis, intervals all examined and without medically relevant abnormality. ST segments without concerns for elevations.    Laboratory  Single troponin demonstrated no acute pathology  CBC and BMP without obvious metabolic or inflammatory abnormalities requiring further evaluation   Radiology  DG Chest 2 View  Result Date: 04/29/2023 CLINICAL DATA:  Chest pain EXAM: CHEST - 2 VIEW COMPARISON:  02/05/2015 FINDINGS: The heart size and mediastinal contours are within normal  limits. Both lungs are clear. The visualized skeletal structures are unremarkable. IMPRESSION: No active cardiopulmonary disease. Electronically Signed   By: Minerva Fester M.D.   On: 04/29/2023 19:07    Final Assessment and Plan: Treated with anti-inflammatories, Tylenol, Pepcid with plan for reassessment.  On reassessment symptoms continue to be completely resolved. Likely musculoskeletal secondary to increase in stressors versus gastroesophageal secondary to longstanding history of gastritis.  Will start patient on omeprazole initially daily and plan outpatient follow-up with her primary care provider in outpatient setting for ongoing care and management.  Disposition:  I have considered need for hospitalization, however, considering all of the above, I believe this patient is stable for discharge at this time.  Patient/family educated about specific return precautions for given chief complaint and symptoms.  Patient/family educated about follow-up with PCP.     Patient/family expressed understanding of return precautions and need for follow-up. Patient spoken to regarding all imaging and laboratory results and appropriate follow up for these results. All education provided in verbal form with additional information in written form. Time was allowed for answering of patient questions. Patient  discharged.    Emergency Department Medication Summary:   Medications  acetaminophen (TYLENOL) tablet 1,000 mg (1,000 mg Oral Given 04/29/23 1948)  ketorolac (TORADOL) 15 MG/ML injection 15 mg (15 mg Intravenous Given 04/29/23 1949)  famotidine (PEPCID) tablet 20 mg (20 mg Oral Given 04/29/23 1948)                   Clinical Impression:  1. Chest pain, unspecified type      Discharge   Final Clinical Impression(s) / ED Diagnoses Final diagnoses:  Chest pain, unspecified type    Rx / DC Orders ED Discharge Orders     None         Glyn Ade, MD 04/29/23 2100

## 2023-07-28 ENCOUNTER — Ambulatory Visit
Admission: EM | Admit: 2023-07-28 | Discharge: 2023-07-28 | Disposition: A | Discharge status: Home / Self Care | Payer: BC Managed Care – PPO | Attending: Physician Assistant | Admitting: Physician Assistant

## 2023-07-28 DIAGNOSIS — R051 Acute cough: Secondary | ICD-10-CM | POA: Diagnosis present

## 2023-07-28 DIAGNOSIS — R509 Fever, unspecified: Secondary | ICD-10-CM | POA: Diagnosis present

## 2023-07-28 DIAGNOSIS — U071 COVID-19: Secondary | ICD-10-CM | POA: Insufficient documentation

## 2023-07-28 DIAGNOSIS — R0981 Nasal congestion: Secondary | ICD-10-CM | POA: Insufficient documentation

## 2023-07-28 MED ORDER — BENZONATATE 200 MG PO CAPS: 200.0000 mg | ORAL_CAPSULE | Freq: Three times a day (TID) | ORAL | 0 refills | Status: DC | PRN

## 2023-07-28 NOTE — ED Triage Notes (Signed)
Pt states fever, cough and body aches for the past 5 days. States she has been taking Tylenol at home.

## 2023-07-28 NOTE — Discharge Instructions (Signed)
Results available via mychart in 1 - 2 days

## 2023-07-28 NOTE — ED Provider Notes (Signed)
[facility]    CSN: [id number] Arrival date & time: 07/28/23  1427      History   Chief Complaint Chief Complaint  Patient presents with   Fever    HPI Mariah Frost is a 57 y.o. female.   Patient here concerned with cough x 4 days.  Admits nasal congestion, rhinorrhea.  Cough is dry.  Taking OTC medication w/o relief.  Recently returned from trip to [place].  She reports seatmate on plane was ill.  No known exposures to COVID.  No advil or tylenol today.      History reviewed. No pertinent past medical history.  There are no problems to display for this patient.   History reviewed. No pertinent surgical history.  OB History   No obstetric history on file.      Home Medications    Prior to Admission medications   Medication Sig Start Date End Date Taking? Authorizing Provider  benzonatate (TESSALON) 200 MG capsule Take 1 capsule (200 mg total) by mouth 3 (three) times daily as needed for cough. 07/28/23  Yes Evern Core, PA-C  amoxicillin-clavulanate (AUGMENTIN) 875-125 MG tablet Take 1 tablet by mouth 2 (two) times daily. 02/03/18   Doristine Bosworth, MD  fluticasone (FLONASE) 50 MCG/ACT nasal spray Place 2 sprays into both nostrils 2 (two) times daily. 01/31/18   Doristine Bosworth, MD    Family History Family History  Problem Relation Age of Onset   Heart disease Mother    Diabetes Mother    Heart disease Father    Diabetes Father     Social History Social History   Tobacco Use   Smoking status: Never   Smokeless tobacco: Never  Vaping Use   Vaping status: Never Used  Substance Use Topics   Alcohol use: No    Alcohol/week: 0.0 standard drinks of alcohol   Drug use: No     Allergies   Demerol [meperidine] and Meperidine hcl   Review of Systems Review of Systems  Constitutional:  Positive for appetite change, chills and fever. Negative for fatigue.  HENT:  Positive for congestion and rhinorrhea. Negative for ear pain, nosebleeds,  postnasal drip, sinus pressure, sinus pain and sore throat.   Eyes:  Negative for pain and redness.  Respiratory:  Positive for cough. Negative for shortness of breath and wheezing.   Gastrointestinal:  Negative for abdominal pain, diarrhea, nausea and vomiting.  Musculoskeletal:  Negative for arthralgias and myalgias.  Skin:  Negative for rash.  Neurological:  Negative for light-headedness and headaches.  Hematological:  Negative for adenopathy. Does not bruise/bleed easily.  Psychiatric/Behavioral:  Negative for confusion and sleep disturbance.      Physical Exam Triage Vital Signs ED Triage Vitals  Encounter Vitals Group     BP 07/28/23 1526 93/69     Systolic BP Percentile --      Diastolic BP Percentile --      Pulse Rate 07/28/23 1526 (!) 116     Resp 07/28/23 1526 16     Temp 07/28/23 1526 (!) 100.5 F (38.1 C)     Temp Source 07/28/23 1526 Oral     SpO2 07/28/23 1526 94 %     Weight --      Height --      Head Circumference --      Peak Flow --      Pain Score 07/28/23 1527 5     Pain Loc --      Pain Education --  Exclude from Growth Chart --    No data found.  Updated Vital Signs BP 93/69 (BP Location: Right Arm)   Pulse (!) 116   Temp (!) 100.5 F (38.1 C) (Oral)   Resp 16   LMP 02/02/2015 (Approximate)   SpO2 94%   Visual Acuity Right Eye Distance:   Left Eye Distance:   Bilateral Distance:    Right Eye Near:   Left Eye Near:    Bilateral Near:     Physical Exam Vitals and nursing note reviewed.  Constitutional:      General: She is not in acute distress.    Appearance: Normal appearance. She is well-developed. She is obese. She is not ill-appearing or toxic-appearing.  HENT:     Head: Normocephalic and atraumatic.     Right Ear: Tympanic membrane and ear canal normal. No swelling or tenderness. No middle ear effusion. Tympanic membrane is not injected, scarred, perforated, retracted or bulging.     Left Ear: Tympanic membrane and ear  canal normal. No swelling or tenderness.  No middle ear effusion. Tympanic membrane is not injected, scarred, perforated, retracted or bulging.     Nose: Mucosal edema, congestion and rhinorrhea present. Rhinorrhea is clear.     Right Turbinates: Swollen. Not enlarged.     Left Turbinates: Swollen. Not enlarged.     Right Sinus: No frontal sinus tenderness.     Left Sinus: No maxillary sinus tenderness or frontal sinus tenderness.     Mouth/Throat:     Mouth: Mucous membranes are moist.     Pharynx: Oropharynx is clear. No pharyngeal swelling, oropharyngeal exudate, posterior oropharyngeal erythema or uvula swelling.     Tonsils: No tonsillar exudate or tonsillar abscesses. 0 on the right. 0 on the left.  Eyes:     Extraocular Movements: Extraocular movements intact.     Conjunctiva/sclera: Conjunctivae normal.  Cardiovascular:     Rate and Rhythm: Regular rhythm. Tachycardia present.     Heart sounds: No murmur heard. Pulmonary:     Effort: Pulmonary effort is normal. No respiratory distress.     Breath sounds: Normal breath sounds. No wheezing, rhonchi or rales.  Musculoskeletal:        General: Normal range of motion.     Cervical back: Normal range of motion and neck supple. No rigidity.     Right lower leg: No swelling or tenderness. No edema.     Left lower leg: No swelling or tenderness. No edema.  Skin:    General: Skin is warm and dry.  Neurological:     General: No focal deficit present.     Mental Status: She is alert and oriented to person, place, and time.     Motor: No weakness.     Gait: Gait normal.  Psychiatric:        Mood and Affect: Mood normal.        Behavior: Behavior normal.      UC Treatments / Results  Labs (all labs ordered are listed, but only abnormal results are displayed) Labs Reviewed  SARS CORONAVIRUS 2 (TAT 6-24 HRS)    EKG   Radiology No results found.  Procedures Procedures (including critical care time)  Medications Ordered in  UC Medications - No data to display  Initial Impression / Assessment and Plan / UC Course  I have reviewed the triage vital signs and the nursing notes.  Pertinent labs & imaging results that were available during my care of the patient were reviewed  by me and considered in my medical decision making (see chart for details).     Results available via mychart Take [id number] for nasal congestion Follow up with PCP Return or go to ED with new or worsening symptoms Final Clinical Impressions(s) / UC Diagnoses   Final diagnoses:  Acute cough  Nasal congestion  Fever, unspecified     Discharge Instructions      Results available via mychart in 1 - 2 days     ED Prescriptions     Medication Sig Dispense Auth. Provider   benzonatate (TESSALON) 200 MG capsule Take 1 capsule (200 mg total) by mouth 3 (three) times daily as needed for cough. 20 capsule Evern Core, PA-C      PDMP not reviewed this encounter.   Evern Core, PA-C 07/28/23 1611

## 2023-07-29 LAB — SARS CORONAVIRUS 2 (TAT 6-24 HRS): SARS Coronavirus 2: POSITIVE — AB

## 2023-07-30 ENCOUNTER — Telehealth: Payer: Self-pay | Admitting: *Deleted

## 2024-10-15 ENCOUNTER — Ambulatory Visit
Admission: EM | Admit: 2024-10-15 | Discharge: 2024-10-15 | Disposition: A | Discharge status: Home / Self Care | Attending: Family Medicine | Admitting: Family Medicine

## 2024-10-15 ENCOUNTER — Other Ambulatory Visit: Payer: Self-pay

## 2024-10-15 DIAGNOSIS — R002 Palpitations: Secondary | ICD-10-CM

## 2024-10-15 NOTE — ED Provider Notes (Signed)
 [facility]    CSN: [id number] Arrival date & time: 10/15/24  1427      History   Chief Complaint No chief complaint on file.   HPI Mariah Frost is a 58 y.o. female presents for palpitations.  Patient denies any past medical history.  Reports over the past week she has been having intermittent palpitations that she describes as a fluttering sensation that lasts a few seconds and resolves on its own.  It may occur 1 or 2 times a day and some days it does not occur at all.  She notices it most if she is rushing or if she sitting still or laying down but when she is at work active she does not notice any.  She denies any chest pain, shortness of breath, nausea/vomiting, syncope, lower extremity swelling, orthopnea.  She does drink a cup of coffee in the morning and some tea at night and reports she drinks no water usually.  Denies any energy drinks or dietary supplements.  No new medications.  Denies any history of CAD or arrhythmias.  No known family history of CAD or arrhythmias.  She has begun drinking more water and feels like it does help.  She also endorses more stress/anxiety recently as well.  She does not currently have a PCP.  No other concerns at this time.  HPI  History reviewed. No pertinent past medical history.  There are no active problems to display for this patient.   History reviewed. No pertinent surgical history.  OB History   No obstetric history on file.      Home Medications    Prior to Admission medications   Medication Sig Start Date End Date Taking? Authorizing Provider  amoxicillin -clavulanate (AUGMENTIN ) 875-125 MG tablet Take 1 tablet by mouth 2 (two) times daily. 02/03/18   Stallings, Zoe A, MD  benzonatate  (TESSALON ) 200 MG capsule Take 1 capsule (200 mg total) by mouth 3 (three) times daily as needed for cough. 07/28/23   Juleen Rush, PA-C  fluticasone  (FLONASE ) 50 MCG/ACT nasal spray Place 2 sprays into both nostrils 2 (two) times  daily. 01/31/18   Bettie Santana LABOR, MD    Family History Family History  Problem Relation Age of Onset   Heart disease Mother    Diabetes Mother    Heart disease Father    Diabetes Father     Social History Social History   Tobacco Use   Smoking status: Never   Smokeless tobacco: Never  Vaping Use   Vaping status: Never Used  Substance Use Topics   Alcohol use: No    Alcohol/week: 0.0 standard drinks of alcohol   Drug use: No     Allergies   Demerol [meperidine] and Meperidine hcl   Review of Systems Review of Systems  Cardiovascular:  Positive for palpitations.     Physical Exam Triage Vital Signs ED Triage Vitals  Encounter Vitals Group     BP 10/15/24 1436 110/76     Girls Systolic BP Percentile --      Girls Diastolic BP Percentile --      Boys Systolic BP Percentile --      Boys Diastolic BP Percentile --      Pulse Rate 10/15/24 1436 83     Resp 10/15/24 1436 16     Temp 10/15/24 1436 97.7 F (36.5 C)     Temp Source 10/15/24 1436 Oral     SpO2 10/15/24 1436 98 %     Weight --  Height --      Head Circumference --      Peak Flow --      Pain Score 10/15/24 1434 0     Pain Loc --      Pain Education --      Exclude from Growth Chart --    No data found.  Updated Vital Signs BP 110/76   Pulse 83   Temp 97.7 F (36.5 C) (Oral)   Resp 16   LMP 02/02/2015 (Approximate)   SpO2 98%   Visual Acuity Right Eye Distance:   Left Eye Distance:   Bilateral Distance:    Right Eye Near:   Left Eye Near:    Bilateral Near:     Physical Exam Vitals and nursing note reviewed.  Constitutional:      General: She is not in acute distress.    Appearance: Normal appearance. She is not ill-appearing.  HENT:     Head: Normocephalic and atraumatic.  Eyes:     Pupils: Pupils are equal, round, and reactive to light.  Cardiovascular:     Rate and Rhythm: Normal rate and regular rhythm.     Heart sounds: Normal heart sounds. No murmur  heard. Pulmonary:     Effort: Pulmonary effort is normal.     Breath sounds: Normal breath sounds.  Skin:    General: Skin is warm and dry.  Neurological:     General: No focal deficit present.     Mental Status: She is alert and oriented to person, place, and time.  Psychiatric:        Mood and Affect: Mood normal.        Behavior: Behavior normal.      UC Treatments / Results  Labs (all labs ordered are listed, but only abnormal results are displayed) Labs Reviewed - No data to display  EKG   Radiology No results found.  Procedures ED EKG  Date/Time: 10/15/2024 3:28 PM  Performed by: Loreda Myla SAUNDERS, NP Authorized by: Loreda Myla SAUNDERS, NP   ECG interpreted by ED Physician in the absence of a cardiologist: no   Rate:    ECG rate:  81   ECG rate assessment: normal   Rhythm:    Rhythm: sinus rhythm     Rhythm comment:  Sinus rhythm with sinus arrhythmia Ectopy:    Ectopy: none   QRS:    QRS axis:  Normal   QRS conduction: normal   ST segments:    ST segments:  Normal T waves:    T waves: normal   Q waves:    Abnormal Q-waves: not present    (including critical care time)  Medications Ordered in UC Medications - No data to display  Initial Impression / Assessment and Plan / UC Course  I have reviewed the triage vital signs and the nursing notes.  Pertinent labs & imaging results that were available during my care of the patient were reviewed by me and considered in my medical decision making (see chart for details).     Reviewed exam and symptoms with patient.  Discussed multiple causes of palpitations including thyroid, electrolytes, dehydration, cardiac, excetra.  Patient has no symptoms at time of evaluation.  EKG without any ectopy or arrhythmias.  Nursing staff was able to establish patient with a PCP which she will follow-up for further evaluation of her palpitations.  Did advise cutting back on caffeine and continuing to increase her water intake as she  has reported this  is helping.  She was instructed to go to the ER for any worsening symptoms, red flags reviewed and she verbalized understanding. Final Clinical Impressions(s) / UC Diagnoses   Final diagnoses:  Palpitations     Discharge Instructions      Please follow-up with your PCP at your scheduled appointment for further workup of your palpitations.  Try to avoid caffeine and please increase your water intake.  Monitor your symptoms closely and if you are noticing increase in frequency of palpitations, you are noticing shortness of breath or chest pain with these please go to the emergency room ASAP for further evaluation.  I hope you feel better soon!    ED Prescriptions   None    PDMP not reviewed this encounter.   Loreda Myla SAUNDERS, NP 10/15/24 1530

## 2024-10-15 NOTE — Discharge Instructions (Addendum)
 Please follow-up with your PCP at your scheduled appointment for further workup of your palpitations.  Try to avoid caffeine and please increase your water intake.  Monitor your symptoms closely and if you are noticing increase in frequency of palpitations, you are noticing shortness of breath or chest pain with these please go to the emergency room ASAP for further evaluation.  I hope you feel better soon!

## 2024-10-15 NOTE — ED Triage Notes (Addendum)
 Pt states feels a flutter in her chestx1wk. Pt denies N/V, SOB. Pt denies chest pain. Pt states she took ASA 81 mg at 1415 today

## 2024-11-02 ENCOUNTER — Other Ambulatory Visit: Payer: Self-pay

## 2024-11-02 ENCOUNTER — Ambulatory Visit
Admission: EM | Admit: 2024-11-02 | Discharge: 2024-11-02 | Disposition: A | Discharge status: Home / Self Care | Attending: Physician Assistant | Admitting: Physician Assistant

## 2024-11-02 DIAGNOSIS — R6889 Other general symptoms and signs: Secondary | ICD-10-CM

## 2024-11-02 DIAGNOSIS — R42 Dizziness and giddiness: Secondary | ICD-10-CM | POA: Diagnosis not present

## 2024-11-02 LAB — GLUCOSE, POCT (MANUAL RESULT ENTRY): POC Glucose: 114 mg/dL — AB (ref 70–99)

## 2024-11-02 MED ORDER — FLUTICASONE PROPIONATE 50 MCG/ACT NA SUSP: 1.0000 | Freq: Every day | NASAL | 0 refills | Status: AC

## 2024-11-02 MED ORDER — ONDANSETRON 4 MG PO TBDP: 4.0000 mg | ORAL_TABLET | Freq: Three times a day (TID) | ORAL | 0 refills | Status: AC | PRN

## 2024-11-02 MED ORDER — AMOXICILLIN-POT CLAVULANATE 875-125 MG PO TABS: 1.0000 | ORAL_TABLET | Freq: Two times a day (BID) | ORAL | 0 refills | Status: DC

## 2024-11-02 NOTE — ED Triage Notes (Signed)
 Pt presents with c/o generalized weakness and dizziness today. Has been sick with flu-like symptoms for one week. Went to her job today and felt like she was going to black out. Drove herself here. Told registration that she was needing to sit down. VSS in triage room. Has only ate one piece of toast + took sips of water today.

## 2024-11-02 NOTE — Discharge Instructions (Addendum)
 Based on your symptoms and duration of illness, I believe you may have a bacterial sinus infection  These typically resolve with antibiotic therapy along with at-home comfort measures  Today I have sent in a prescription for  Augmentin 875-125 mg to be taken by mouth twice per day for 7 days FINISH THE ENTIRE COURSE unless you develop an allergic reaction or are instructed to discontinue.  It can take a few days for the antibiotic to kick in so I recommend symptomatic relief with over the counter medication such as the following: Dayquil/ Nyquil Theraflu Alkaseltzer   If you have high blood pressure I recommend that you take Mucinex, Robitussin, Tylenol instead of the combination medications.  Combination medications typically have a decongestant in them that can cause your blood pressure to be high.  These medications typically have Tylenol in them already so you do not need to supplement with more outside of those medications  Stay well hydrated with at least 75 oz of water per day to help with recovery  If at any point you start to develop swelling around the eyes and nose, trouble seeing, fevers that are not responding to medications, trouble breathing, passing out or headaches that are very severe please go to the emergency room for further evaluation management

## 2024-11-02 NOTE — ED Provider Notes (Signed)
 [facility]    CSN: [id number] Arrival date & time: 11/02/24  1117      History   Chief Complaint Chief Complaint  Mariah Frost presents with   Weakness   Dizziness    HPI Mariah Frost is a 58 y.o. female.   HPI Pt is here today with concerns of not feeling well. Mariah Frost reports Mariah Frost has had headaches, mild nausea, reduced appetite, fatigue for the last week. Mariah Frost reports fever of 103 that broke yesterday- Mariah Frost reports it was responsive to OTC antipyretics.  Mariah Frost reports a productive cough of yellow phlegm, postnasal drainage, back pain. Mariah Frost states that a few of her colleagues supposedly came to work sick and Mariah Frost thinks this where Mariah Frost picked up current illness. Interventions: Tylenol  and ibuprofen  Mariah Frost denies previous hx of asthma or copd     History reviewed. No pertinent past medical history.  There are no active problems to display for this Mariah Frost.   History reviewed. No pertinent surgical history.  OB History   No obstetric history on file.      Home Medications    Prior to Admission medications  Medication Sig Start Date End Date Taking? Authorizing Provider  amoxicillin -clavulanate (AUGMENTIN ) 875-125 MG tablet Take 1 tablet by mouth every 12 (twelve) hours. 11/02/24  Yes Lyndsey Demos E, PA-C  fluticasone  (FLONASE ) 50 MCG/ACT nasal spray Place 1 spray into both nostrils daily. 11/02/24  Yes Alyssa Mancera E, PA-C  ondansetron  (ZOFRAN -ODT) 4 MG disintegrating tablet Take 1 tablet (4 mg total) by mouth every 8 (eight) hours as needed for nausea or vomiting. 11/02/24  Yes Latwan Luchsinger, Rocky BRAVO, PA-C    Family History Family History  Problem Relation Age of Onset   Heart disease Mother    Diabetes Mother    Heart disease Father    Diabetes Father     Social History Social History[1]   Allergies   Demerol [meperidine] and Meperidine hcl   Review of Systems Review of Systems  Constitutional:  Positive for appetite change, chills, fatigue and fever.  HENT:   Positive for congestion, postnasal drip and rhinorrhea. Negative for ear pain and sore throat.   Respiratory:  Positive for cough. Negative for shortness of breath and wheezing.   Gastrointestinal:  Positive for nausea. Negative for abdominal pain, diarrhea and vomiting.  Musculoskeletal:  Positive for back pain.  Neurological:  Positive for headaches.     Physical Exam Triage Vital Signs ED Triage Vitals  Encounter Vitals Group     BP 11/02/24 1143 104/71     Girls Systolic BP Percentile --      Girls Diastolic BP Percentile --      Boys Systolic BP Percentile --      Boys Diastolic BP Percentile --      Pulse Rate 11/02/24 1143 (!) 107     Resp 11/02/24 1143 18     Temp 11/02/24 1143 98.5 F (36.9 C)     Temp Source 11/02/24 1143 Oral     SpO2 11/02/24 1143 97 %     Weight 11/02/24 1144 130 lb 15.3 oz (59.4 kg)     Height 11/02/24 1144 5' 3 (1.6 m)     Head Circumference --      Peak Flow --      Pain Score 11/02/24 1144 8     Pain Loc --      Pain Education --      Exclude from Growth Chart --    No  data found.  Updated Vital Signs BP 104/71 (BP Location: Right Arm)   Pulse (!) 107   Temp 98.5 F (36.9 C) (Oral)   Resp 18   Ht 5' 3 (1.6 m)   Wt 130 lb 15.3 oz (59.4 kg)   LMP 02/02/2015   SpO2 97%   BMI 23.20 kg/m   Visual Acuity Right Eye Distance:   Left Eye Distance:   Bilateral Distance:    Right Eye Near:   Left Eye Near:    Bilateral Near:     Physical Exam Vitals reviewed.  Constitutional:      General: Mariah Frost is awake.     Appearance: Normal appearance. Mariah Frost is well-developed and well-groomed.  HENT:     Head: Normocephalic and atraumatic.     Right Ear: Hearing, tympanic membrane and ear canal normal.     Left Ear: Hearing, tympanic membrane and ear canal normal.     Mouth/Throat:     Lips: Pink.     Mouth: Mucous membranes are moist.     Pharynx: Oropharynx is clear. Uvula midline. No pharyngeal swelling, oropharyngeal exudate, posterior  oropharyngeal erythema, uvula swelling or postnasal drip.  Cardiovascular:     Rate and Rhythm: Normal rate and regular rhythm.     Pulses: Normal pulses.          Radial pulses are 2+ on the right side and 2+ on the left side.     Heart sounds: Normal heart sounds. No murmur heard.    No friction rub. No gallop.  Pulmonary:     Effort: Pulmonary effort is normal.     Breath sounds: Normal breath sounds. No decreased air movement. No decreased breath sounds, wheezing, rhonchi or rales.  Musculoskeletal:     Cervical back: Normal range of motion and neck supple.  Lymphadenopathy:     Head:     Right side of head: No submental, submandibular or preauricular adenopathy.     Left side of head: No submental, submandibular or preauricular adenopathy.     Cervical:     Right cervical: No superficial cervical adenopathy.    Left cervical: No superficial cervical adenopathy.     Upper Body:     Right upper body: No supraclavicular adenopathy.     Left upper body: No supraclavicular adenopathy.  Neurological:     Mental Status: Mariah Frost is alert.  Psychiatric:        Mood and Affect: Mood normal.        Behavior: Behavior normal. Behavior is cooperative.        Thought Content: Thought content normal.        Judgment: Judgment normal.      UC Treatments / Results  Labs (all labs ordered are listed, but only abnormal results are displayed) Labs Reviewed  GLUCOSE, POCT (MANUAL RESULT ENTRY) - Abnormal; Notable for the following components:      Result Value   POC Glucose 114 (*)    All other components within normal limits    EKG   Radiology No results found.  Procedures Procedures (including critical care time)  Medications Ordered in UC Medications - No data to display  Initial Impression / Assessment and Plan / UC Course  I have reviewed the triage vital signs and the nursing notes.  Pertinent labs & imaging results that were available during my care of the Mariah Frost were  reviewed by me and considered in my medical decision making (see chart for details).  Final Clinical Impressions(s) / UC Diagnoses   Final diagnoses:  Lightheadedness  Flu-like symptoms   Mariah Frost is here today for concerns of headaches, mild nausea, reduced appetite and fatigue has been ongoing for the last 7 days.  Mariah Frost states that Mariah Frost has also had a fever that resolved yesterday and was responsive to OTC antipyretics.  Physical exam and vitals are largely reassuring with the exception of elevated heart rate.  At this time suspect likely viral URI but given persistence of symptoms with worsening severity will start Mariah Frost on Augmentin  p.o. twice daily x 7 days.  Given decreased p.o. intake and nausea will also send prescription for Zofran .  Reviewed appropriate OTC medications to assist with symptomatic relief and will send prescription for Flonase  as well.  ED and return precautions reviewed and provided in AVS.  Follow-up as needed.    Discharge Instructions      Based on your symptoms and duration of illness, I believe you may have a bacterial sinus infection  These typically resolve with antibiotic therapy along with at-home comfort measures  Today I have sent in a prescription for  Augmentin  875-125 mg to be taken by mouth twice per day for 7 days FINISH THE ENTIRE COURSE unless you develop an allergic reaction or are instructed to discontinue.  It can take a few days for the antibiotic to kick in so I recommend symptomatic relief with over the counter medication such as the following: Dayquil/ Nyquil Theraflu Alkaseltzer   If you have high blood pressure I recommend that you take Mucinex , Robitussin, Tylenol  instead of the combination medications.  Combination medications typically have a decongestant in them that can cause your blood pressure to be high.  These medications typically have Tylenol  in them already so you do not need to supplement with more outside of those  medications  Stay well hydrated with at least 75 oz of water per day to help with recovery  If at any point you start to develop swelling around the eyes and nose, trouble seeing, fevers that are not responding to medications, trouble breathing, passing out or headaches that are very severe please go to the emergency room for further evaluation management      ED Prescriptions     Medication Sig Dispense Auth. Provider   amoxicillin -clavulanate (AUGMENTIN ) 875-125 MG tablet Take 1 tablet by mouth every 12 (twelve) hours. 14 tablet Tashika Goodin E, PA-C   fluticasone  (FLONASE ) 50 MCG/ACT nasal spray Place 1 spray into both nostrils daily. 11.1 mL Beila Purdie E, PA-C   ondansetron  (ZOFRAN -ODT) 4 MG disintegrating tablet Take 1 tablet (4 mg total) by mouth every 8 (eight) hours as needed for nausea or vomiting. 20 tablet Yecheskel Kurek E, PA-C      PDMP not reviewed this encounter.     [1]  Social History Tobacco Use   Smoking status: Never   Smokeless tobacco: Never  Vaping Use   Vaping status: Never Used  Substance Use Topics   Alcohol use: No    Alcohol/week: 0.0 standard drinks of alcohol   Drug use: No     Aylana Hirschfeld, Rocky BRAVO, PA-C 11/02/24 1659  "

## 2024-11-05 ENCOUNTER — Ambulatory Visit: Payer: Self-pay

## 2024-11-18 ENCOUNTER — Ambulatory Visit: Payer: Self-pay

## 2024-11-19 ENCOUNTER — Ambulatory Visit
Admission: RE | Admit: 2024-11-19 | Discharge: 2024-11-19 | Disposition: A | Discharge status: Home / Self Care | Payer: Self-pay | Source: Ambulatory Visit | Attending: Emergency Medicine | Admitting: Emergency Medicine

## 2024-11-19 VITALS — BP 116/79 | HR 84 | Temp 97.9°F | Resp 17

## 2024-11-19 DIAGNOSIS — R051 Acute cough: Secondary | ICD-10-CM | POA: Diagnosis not present

## 2024-11-19 DIAGNOSIS — J069 Acute upper respiratory infection, unspecified: Secondary | ICD-10-CM | POA: Diagnosis not present

## 2024-11-19 DIAGNOSIS — H9201 Otalgia, right ear: Secondary | ICD-10-CM | POA: Diagnosis not present

## 2024-11-19 MED ORDER — AMOXICILLIN-POT CLAVULANATE 400-57 MG/5ML PO SUSR: 400.0000 mg | Freq: Two times a day (BID) | ORAL | 0 refills | Status: AC

## 2024-11-19 MED ORDER — PROMETHAZINE-DM 6.25-15 MG/5ML PO SYRP: 5.0000 mL | ORAL_SOLUTION | Freq: Every evening | ORAL | 0 refills | Status: AC | PRN

## 2024-11-19 MED ORDER — PREDNISONE 20 MG PO TABS: 40.0000 mg | ORAL_TABLET | Freq: Every day | ORAL | 0 refills | Status: AC

## 2024-11-19 MED ORDER — BENZONATATE 100 MG PO CAPS: 100.0000 mg | ORAL_CAPSULE | Freq: Three times a day (TID) | ORAL | 0 refills | Status: AC

## 2024-11-19 NOTE — ED Provider Notes (Signed)
 " [facility]    CSN: [id number] Arrival date & time: 11/19/24  1059      History   Chief Complaint Chief Complaint  Patient presents with   Cough    still sick from two weeks ago my ear hurts my jaw hurts and l can't take this medicine that's prescribed - Entered by patient    HPI Mariah Frost is a 59 y.o. female.   Patient presents with continued cough after being seen here on 12/27.  Patient was prescribed Augmentin  at that time for ongoing upper respiratory infection.  Patient states that she tried to take a few doses of this but could not swallow the pill and when she did swallow the pill it made her nauseous.  Patient is requesting liquid antibiotic versus pills so that she is able to keep this down.  Patient reports that a few days after she was seen here she did start to develop right ear pain and right sided jaw pain as well.  Patient states that this pain has persisted and has become a little worse over the last few days.  Patient denies any persistent fever.  Patient denies any chest pain or shortness of breath.  Patient denies taking any medication for the cough.  The history is provided by the patient and medical records.  Cough   History reviewed. No pertinent past medical history.  There are no active problems to display for this patient.   History reviewed. No pertinent surgical history.  OB History   No obstetric history on file.      Home Medications    Prior to Admission medications  Medication Sig Start Date End Date Taking? Authorizing Provider  amoxicillin -clavulanate (AUGMENTIN ) 400-57 MG/5ML suspension Take 5 mLs (400 mg total) by mouth 2 (two) times daily for 7 days. 11/19/24 11/26/24 Yes Johnie, Nishanth Mccaughan A, NP  benzonatate  (TESSALON ) 100 MG capsule Take 1 capsule (100 mg total) by mouth every 8 (eight) hours. 11/19/24  Yes Johnie, Eliu Batch A, NP  predniSONE  (DELTASONE ) 20 MG tablet Take 2 tablets (40 mg total) by mouth daily for 5 days.  11/19/24 11/24/24 Yes Johnie Flaming A, NP  promethazine -dextromethorphan (PROMETHAZINE -DM) 6.25-15 MG/5ML syrup Take 5 mLs by mouth at bedtime as needed for cough. 11/19/24  Yes Johnie, Wildon Cuevas A, NP  fluticasone  (FLONASE ) 50 MCG/ACT nasal spray Place 1 spray into both nostrils daily. 11/02/24   Mecum, Erin E, PA-C  ondansetron  (ZOFRAN -ODT) 4 MG disintegrating tablet Take 1 tablet (4 mg total) by mouth every 8 (eight) hours as needed for nausea or vomiting. 11/02/24   Mecum, Rocky BRAVO, PA-C    Family History Family History  Problem Relation Age of Onset   Heart disease Mother    Diabetes Mother    Heart disease Father    Diabetes Father     Social History Social History[1]   Allergies   Demerol [meperidine] and Meperidine hcl   Review of Systems Review of Systems  Respiratory:  Positive for cough.    Per HPI  Physical Exam Triage Vital Signs ED Triage Vitals  Encounter Vitals Group     BP 11/19/24 1109 116/79     Girls Systolic BP Percentile --      Girls Diastolic BP Percentile --      Boys Systolic BP Percentile --      Boys Diastolic BP Percentile --      Pulse Rate 11/19/24 1109 84     Resp 11/19/24 1109 17  Temp 11/19/24 1109 97.9 F (36.6 C)     Temp Source 11/19/24 1109 Oral     SpO2 11/19/24 1109 99 %     Weight --      Height --      Head Circumference --      Peak Flow --      Pain Score 11/19/24 1108 7     Pain Loc --      Pain Education --      Exclude from Growth Chart --    No data found.  Updated Vital Signs BP 116/79 (BP Location: Right Arm)   Pulse 84   Temp 97.9 F (36.6 C) (Oral)   Resp 17   LMP 02/02/2015   SpO2 99%   Visual Acuity Right Eye Distance:   Left Eye Distance:   Bilateral Distance:    Right Eye Near:   Left Eye Near:    Bilateral Near:     Physical Exam Vitals and nursing note reviewed.  Constitutional:      General: She is awake. She is not in acute distress.    Appearance: Normal appearance. She is  well-developed and well-groomed. She is not ill-appearing.  HENT:     Right Ear: Tympanic membrane, ear canal and external ear normal.     Left Ear: Tympanic membrane, ear canal and external ear normal.     Nose: Congestion and rhinorrhea present.     Mouth/Throat:     Mouth: Mucous membranes are moist.     Pharynx: Posterior oropharyngeal erythema and postnasal drip present. No oropharyngeal exudate.  Cardiovascular:     Rate and Rhythm: Normal rate and regular rhythm.  Pulmonary:     Effort: Pulmonary effort is normal.     Breath sounds: Normal breath sounds.  Skin:    General: Skin is warm and dry.  Neurological:     General: No focal deficit present.     Mental Status: She is alert and oriented to person, place, and time. Mental status is at baseline.  Psychiatric:        Behavior: Behavior is cooperative.      UC Treatments / Results  Labs (all labs ordered are listed, but only abnormal results are displayed) Labs Reviewed - No data to display  EKG   Radiology No results found.  Procedures Procedures (including critical care time)  Medications Ordered in UC Medications - No data to display  Initial Impression / Assessment and Plan / UC Course  I have reviewed the triage vital signs and the nursing notes.  Pertinent labs & imaging results that were available during my care of the patient were reviewed by me and considered in my medical decision making (see chart for details).     Patient is overall well-appearing.  Vitals are stable.  Exam findings consistent with continued upper respiratory infection.  No suspicion for pneumonia at this time due to lack of fever, chest pain, or worsening cough.  Prescribed Augmentin  in liquid form for treatment of continued upper respiratory infection.  Prescribed prednisone  for additional relief of this.  Prescribed Tessalon  and Promethazine  DM as needed for cough.  Discussed over-the-counter medications for symptoms.  Discussed  follow-up and return precautions. Final Clinical Impressions(s) / UC Diagnoses   Final diagnoses:  Acute upper respiratory infection  Right ear pain  Acute cough     Discharge Instructions      I have prescribed the Augmentin  and liquid form.  Take this twice a day  for 7 days. I have also prescribed prednisone  for you to take to help with inflammation related to your symptoms.  Take 2 tablets once daily for 5 days. You can take Tessalon  Perles every 8 hours as needed for cough. You can take Promethazine  DM cough syrup at bedtime as needed for cough.  This can make you drowsy so do not drive, work, or drink alcohol while taking this. You can also take over-the-counter Mucinex  as needed for cough and congestion as well. Follow-up with your primary care provider or return here as needed.     ED Prescriptions     Medication Sig Dispense Auth. Provider   amoxicillin -clavulanate (AUGMENTIN ) 400-57 MG/5ML suspension Take 5 mLs (400 mg total) by mouth 2 (two) times daily for 7 days. 70 mL Johnie Flaming A, NP   predniSONE  (DELTASONE ) 20 MG tablet Take 2 tablets (40 mg total) by mouth daily for 5 days. 10 tablet Johnie Flaming A, NP   promethazine -dextromethorphan (PROMETHAZINE -DM) 6.25-15 MG/5ML syrup Take 5 mLs by mouth at bedtime as needed for cough. 118 mL Johnie Flaming A, NP   benzonatate  (TESSALON ) 100 MG capsule Take 1 capsule (100 mg total) by mouth every 8 (eight) hours. 21 capsule Johnie Flaming A, NP      PDMP not reviewed this encounter.    [1]  Social History Tobacco Use   Smoking status: Never   Smokeless tobacco: Never  Vaping Use   Vaping status: Never Used  Substance Use Topics   Alcohol use: No    Alcohol/week: 0.0 standard drinks of alcohol   Drug use: No     Johnie Flaming LABOR, NP 11/19/24 1208  "

## 2024-11-19 NOTE — ED Triage Notes (Signed)
 Pt c/o cough, right side jaw pain, right ear pain for 2 weeks.  She was seen on 12/27 at [facility] and given Augmentin . States she could not take it because she couldn't swallow the large pill and it made her nauseous which may have been due to her not eating much at that time.

## 2024-11-19 NOTE — Discharge Instructions (Signed)
 I have prescribed the Augmentin  and liquid form.  Take this twice a day for 7 days. I have also prescribed prednisone  for you to take to help with inflammation related to your symptoms.  Take 2 tablets once daily for 5 days. You can take Tessalon  Perles every 8 hours as needed for cough. You can take Promethazine  DM cough syrup at bedtime as needed for cough.  This can make you drowsy so do not drive, work, or drink alcohol while taking this. You can also take over-the-counter Mucinex  as needed for cough and congestion as well. Follow-up with your primary care provider or return here as needed.

## 2024-11-27 ENCOUNTER — Ambulatory Visit: Admitting: Nurse Practitioner

## 2024-12-31 ENCOUNTER — Ambulatory Visit: Admitting: Nurse Practitioner
